# Patient Record
Sex: Female | Born: 1970 | Hispanic: No | Marital: Married | Smoking: Never smoker
Health system: Southern US, Community
[De-identification: ages and names within clinical notes are randomized; demographics above are authoritative.]

## PROBLEM LIST (undated history)

## (undated) DIAGNOSIS — R112 Nausea with vomiting, unspecified: Secondary | ICD-10-CM

## (undated) DIAGNOSIS — R51 Headache: Secondary | ICD-10-CM

## (undated) DIAGNOSIS — R519 Headache, unspecified: Secondary | ICD-10-CM

## (undated) DIAGNOSIS — R002 Palpitations: Secondary | ICD-10-CM

## (undated) DIAGNOSIS — J302 Other seasonal allergic rhinitis: Secondary | ICD-10-CM

## (undated) DIAGNOSIS — M1990 Unspecified osteoarthritis, unspecified site: Secondary | ICD-10-CM

## (undated) DIAGNOSIS — Z9889 Other specified postprocedural states: Secondary | ICD-10-CM

## (undated) DIAGNOSIS — E079 Disorder of thyroid, unspecified: Secondary | ICD-10-CM

## (undated) HISTORY — PX: WISDOM TOOTH EXTRACTION: SHX21

## (undated) HISTORY — DX: Disorder of thyroid, unspecified: E07.9

## (undated) HISTORY — PX: DILATION AND EVACUATION: SHX1459

---

## 1999-02-25 ENCOUNTER — Other Ambulatory Visit: Admission: RE | Admit: 1999-02-25 | Discharge: 1999-02-25 | Payer: Self-pay | Admitting: Obstetrics and Gynecology

## 2000-05-02 ENCOUNTER — Other Ambulatory Visit: Admission: RE | Admit: 2000-05-02 | Discharge: 2000-05-02 | Payer: Self-pay | Admitting: Family Medicine

## 2000-10-05 ENCOUNTER — Other Ambulatory Visit: Admission: RE | Admit: 2000-10-05 | Discharge: 2000-10-05 | Payer: Self-pay | Admitting: Family Medicine

## 2002-02-20 ENCOUNTER — Other Ambulatory Visit: Admission: RE | Admit: 2002-02-20 | Discharge: 2002-02-20 | Payer: Self-pay | Admitting: Obstetrics and Gynecology

## 2003-05-01 ENCOUNTER — Other Ambulatory Visit: Admission: RE | Admit: 2003-05-01 | Discharge: 2003-05-01 | Payer: Self-pay | Admitting: Obstetrics and Gynecology

## 2004-06-10 ENCOUNTER — Other Ambulatory Visit: Admission: RE | Admit: 2004-06-10 | Discharge: 2004-06-10 | Payer: Self-pay | Admitting: Obstetrics and Gynecology

## 2004-09-01 ENCOUNTER — Encounter: Admission: RE | Admit: 2004-09-01 | Discharge: 2004-09-01 | Payer: Self-pay | Admitting: Family Medicine

## 2005-08-04 ENCOUNTER — Other Ambulatory Visit: Admission: RE | Admit: 2005-08-04 | Discharge: 2005-08-04 | Payer: Self-pay | Admitting: Obstetrics and Gynecology

## 2014-11-03 LAB — CBC AND DIFFERENTIAL
HCT: 38 % (ref 36–46)
HEMOGLOBIN: 12.3 g/dL (ref 12.0–16.0)
Platelets: 303 10*3/uL (ref 150–399)
WBC: 6.3 10^3/mL

## 2014-11-03 LAB — LIPID PANEL
CHOLESTEROL: 158 mg/dL (ref 0–200)
HDL: 105 mg/dL — AB (ref 35–70)
LDL CALC: 94 mg/dL
LDL/HDL RATIO: 3
TRIGLYCERIDES: 55 mg/dL (ref 40–160)

## 2014-11-03 LAB — BASIC METABOLIC PANEL
BUN: 8 mg/dL (ref 4–21)
Creatinine: 0.6 mg/dL (ref 0.5–1.1)
GLUCOSE: 73 mg/dL
Potassium: 4.5 mmol/L (ref 3.4–5.3)
Sodium: 138 mmol/L (ref 137–147)

## 2014-11-03 LAB — HEPATIC FUNCTION PANEL
ALT: 22 U/L (ref 7–35)
AST: 19 U/L (ref 13–35)
BILIRUBIN, TOTAL: 0.8 mg/dL

## 2014-11-03 LAB — TSH: TSH: 0.01 u[IU]/mL — AB (ref <=5.90)

## 2014-12-01 ENCOUNTER — Ambulatory Visit (INDEPENDENT_AMBULATORY_CARE_PROVIDER_SITE_OTHER): Payer: Managed Care, Other (non HMO) | Admitting: Internal Medicine

## 2014-12-01 ENCOUNTER — Encounter: Payer: Self-pay | Admitting: Internal Medicine

## 2014-12-01 VITALS — BP 104/64 | HR 81 | Temp 98.5°F | Resp 12 | Ht 63.75 in | Wt 150.0 lb

## 2014-12-01 DIAGNOSIS — E0590 Thyrotoxicosis, unspecified without thyrotoxic crisis or storm: Secondary | ICD-10-CM

## 2014-12-01 LAB — TSH: TSH: 0.22 u[IU]/mL — AB (ref 0.35–4.50)

## 2014-12-01 LAB — T3, FREE: T3, Free: 3.4 pg/mL (ref 2.3–4.2)

## 2014-12-01 LAB — T4, FREE: Free T4: 1.19 ng/dL (ref 0.60–1.60)

## 2014-12-01 NOTE — Patient Instructions (Signed)
Please stop at the lab. Please try to join MyChart for easier communication. Please return in 3 months.  Hyperthyroidism The thyroid is a large gland located in the lower front part of your neck. The thyroid helps control metabolism. Metabolism is how your body uses food. It controls metabolism with the hormone thyroxine. When the thyroid is overactive, it produces too much hormone. When this happens, these following problems may occur:   Nervousness  Heat intolerance  Weight loss (in spite of increase food intake)  Diarrhea  Change in hair or skin texture  Palpitations (heart skipping or having extra beats)  Tachycardia (rapid heart rate)  Loss of menstruation (amenorrhea)  Shaking of the hands CAUSES  Grave's Disease (the immune system attacks the thyroid gland). This is the most common cause.  Inflammation of the thyroid gland.  Tumor (usually benign) in the thyroid gland or elsewhere.  Excessive use of thyroid medications (both prescription and 'natural').  Excessive ingestion of Iodine. DIAGNOSIS  To prove hyperthyroidism, your caregiver may do blood tests and ultrasound tests. Sometimes the signs are hidden. It may be necessary for your caregiver to watch this illness with blood tests, either before or after diagnosis and treatment. TREATMENT Short-term treatment There are several treatments to control symptoms. Drugs called beta blockers may give some relief. Drugs that decrease hormone production will provide temporary relief in many people. These measures will usually not give permanent relief. Definitive therapy There are treatments available which can be discussed between you and your caregiver which will permanently treat the problem. These treatments range from surgery (removal of the thyroid), to the use of radioactive iodine (destroys the thyroid by radiation), to the use of antithyroid drugs (interfere with hormone synthesis). The first two treatments are  permanent and usually successful. They most often require hormone replacement therapy for life. This is because it is impossible to remove or destroy the exact amount of thyroid required to make a person euthyroid (normal). HOME CARE INSTRUCTIONS  See your caregiver if the problems you are being treated for get worse. Examples of this would be the problems listed above. SEEK MEDICAL CARE IF: Your general condition worsens. MAKE SURE YOU:   Understand these instructions.  Will watch your condition.  Will get help right away if you are not doing well or get worse. Document Released: 11/21/2005 Document Revised: 02/13/2012 Document Reviewed: 04/04/2007 [facility] Patient Information 2015 [place], [place]. This information is not intended to replace advice given to you by your health care provider. Make sure you discuss any questions you have with your health care provider.

## 2014-12-01 NOTE — Progress Notes (Addendum)
Patient ID: Rose Alvarez, female   DOB: 10/08/1971, 43 y.o.   MRN: [id number]   HPI  Rose Alvarez is a 43 y.o.-year-old female, referred by her PCP, Dr. Charise CarwinSwaine, for management of thyrotoxicosis.  She had a physical exam recently (10/23/2014) >> incidentally found to have a low TSH.  I reviewed pt's thyroid tests: Lab Results  Component Value Date   TSH 0.01* 11/03/2014  11/03/2014: TSH <0.01; free T4 1.16 (0.61-1.12); total T3 163 (71-190) 10/23/2014: TSH <0.01  Pt denies feeling nodules in neck, hoarseness, dysphagia/odynophagia, SOB with lying down; she c/o: - + fatigue - + hot flushes; on OCP - + mm aches - + HAs - + chest tightness, no SOB - + palpitations - no tremors - no anxiety - occasional diarrhea - + mild weight loss - not since 10/2014  Pt does not have a FH of thyroid ds. No FH of thyroid cancer. No h/o radiation tx to head or neck.  No seaweed or kelp, no recent contrast studies. No steroid use. No herbal supplements.   Reviewed records from PCP: - 10/23/2014: CBC with diff: normal; CMP: normal; Lipid panel: 158/55/53/94  ROS: Constitutional: + occasional poor sleep Eyes: no blurry vision, c/o L lower eyelid swelling, no xerophthalmia ENT: no sore throat, no nodules palpated in throat, no dysphagia/odynophagia, no hoarseness Cardiovascular: + chest pressure/SOB/palpitations/+ leg swelling Respiratory: no cough/SOB Gastrointestinal: no N/V/+ D/no C Musculoskeletal: + muscle/+ joint aches Skin: no rashes, + easy bruising Neurological: no tremors/numbness/tingling/dizziness, + HAs Psychiatric: no depression/anxiety + low libido  Past Medical History  Diagnosis Date  . Thyroid disease     Hyperthyroidism   . Dyslipidemia     Hx of mild dyslipidemia   . Hyperlipidemia    Past Surgical History  Procedure Laterality Date  . Dilation and evacuation     History   Social History  . Marital Status: Married    Spouse Name: N/A    Number of  Children: 2   Occupational History  . CNA   Social History Main Topics  . Smoking status: Never Smoker   . Smokeless tobacco: No  . Alcohol Use: No  . Drug Use: No   Current Outpatient Rx  Name  Route  Sig  Dispense  Refill  . NORTREL 7/7/7 0.5/0.75/1-35 MG-MCG tablet                No Known Allergies   Family History  Problem Relation Age of Onset  . Hypertension Brother   . Diabetes Maternal Grandmother   . Heart disease Maternal Grandfather 56    Heart attack  . Heart disease Paternal Grandfather [id number]    Heart Attack   PE: BP 104/64 mmHg  Pulse 81  Temp(Src) 98.5 F (36.9 C) (Oral)  Resp 12  Ht 5' 3.75" (1.619 m)  Wt 150 lb (68.04 kg)  BMI 25.96 kg/m2  SpO2 98% Wt Readings from Last 3 Encounters:  12/01/14 150 lb (68.04 kg)   Constitutional: normal weight, in NAD Eyes: PERRLA, EOMI, no exophthalmos, no lid lag, no stare ENT: moist mucous membranes, no thyromegaly, L thyroid fullness; no thyroid bruits, no cervical lymphadenopathy Cardiovascular: RRR, No MRG Respiratory: CTA B Gastrointestinal: abdomen soft, NT, ND, BS+ Musculoskeletal: no deformities, strength intact in all 4 Skin: moist, warm, no rashes Neurological: no tremor with outstretched hands, DTR normal in all 4  ASSESSMENT: 1. Thyrotoxicosis  2. Possible L thyroid nodule  PLAN:  1. Patient with a recently found low  TSH, without clear thyrotoxic sxs (has some minimal weight loss, has hot flushes, occasional looser stools, and feels chest pressure).  - she does not appear to have exogenous causes for the low TSH.  - We discussed that possible causes of thyrotoxicosis are:  Graves ds   Thyroiditis toxic multinodular goiter/ toxic adenoma (I can feel a L sided nodule vs an enlarged/rotated L lobe at palpation of her thyroid). - I suggested that we check the TSH, fT3 and fT4 and also had thyroid stimulating antibodies to screen for Graves' disease.  - If the tests remain abnormal, we may need  an uptake and scan to differentiate between the 3 above possible etiologies  - we discussed about possible modalities of treatment for the above conditions, to include methimazole use, radioactive iodine ablation or (last resort) surgery. - I did advice her that we might need to do thyroid ultrasound depending on the results of the uptake and scan (if a cold nodule is present) - I do not feel that we need to add beta blockers at this time, since she is not tachycardic, anxious, or tremulous - I advised her to join my chart to communicate easier - RTC in 3 months, but likely sooner for repeat labs  Component     Latest Ref Rng 11/03/2014 12/01/2014  TSH     0.35 - 4.50 uIU/mL 0.01 (A) 0.22 (L)  T3, Free     2.3 - 4.2 pg/mL  3.4  Free T4     0.60 - 1.60 ng/dL  1.61  TSI     <096 % baseline  391 (H)   Likely resolving subacute thyroiditis >> repeat labs in 2 months.  02/18/2015  We repeated labs in 2 months, and they were worse: Component     Latest Ref Rng 01/27/2015  T3, Free     2.3 - 4.2 pg/mL 6.3 (H)  Free T4     0.60 - 1.60 ng/dL 0.45 (H)  TSH     4.09 - 4.50 uIU/mL 0.10 (L)   At that point, I arranged for a thyroid uptake and scan, that returned indicative of Graves' disease:  CLINICAL DATA: Thyrotoxicosis with weight loss, increased appetite, heat intolerance and difficulty sleeping. Serum TSH 0.1.  EXAM: THYROID SCAN AND UPTAKE - 24 HOURS  TECHNIQUE: Following the per oral administration of I-131 sodium iodide, the patient returned at 24 hours and uptake measurements were acquired with the uptake probe centered on the neck. Thyroid imaging was performed following the intravenous administration of the Tc-11m Pertechnetate.  RADIOPHARMACEUTICALS: 13.7 microCuries I-131 Sodium Iodide and 10.0 mCi TC-75m Pertechnetate  COMPARISON: None.  FINDINGS: The 24 hr uptake by the thyroid gland is 53.8%. Normal 24 hr uptake is 10-30 %.  On thyroid imaging, the  thyroid activity appears diffusely increased. No focal hot or cold nodules are identified.  IMPRESSION: Elevated 24 hour radio iodine uptake of 54% with homogeneous activity consistent with diffuse toxic goiter (Graves disease).   Electronically Signed  By: Carey Bullocks M.D.  On: 02/17/2015 17:28  At this point, I will suggest that patient starts methimazole, 5 mg once a day.  I will advise her to stop the Methimazole (Tapazole) and call us or your primary care doctor if you develop: - sore throat - fever - yellow skin - dark urine - light colored stools As we will then need to check your blood counts and liver tests.  At next visit, which is coming up in 12 days, we may  increase the dose to 5 mg twice a day she is tolerating it well and also depending on her symptoms.

## 2014-12-04 ENCOUNTER — Encounter: Payer: Self-pay | Admitting: Internal Medicine

## 2014-12-04 ENCOUNTER — Telehealth: Payer: Self-pay | Admitting: Internal Medicine

## 2014-12-04 NOTE — Telephone Encounter (Signed)
Sent MyChart message. Not all labs back yet.

## 2014-12-04 NOTE — Telephone Encounter (Signed)
Please read note below and advise.  

## 2014-12-04 NOTE — Telephone Encounter (Signed)
Patient is calling for the results of her lab work  Phone # [phone]

## 2014-12-06 LAB — THYROID STIMULATING IMMUNOGLOBULIN: TSI: 391 % baseline — ABNORMAL HIGH (ref <140)

## 2014-12-23 ENCOUNTER — Encounter: Payer: Self-pay | Admitting: Internal Medicine

## 2015-01-26 ENCOUNTER — Other Ambulatory Visit: Payer: Managed Care, Other (non HMO)

## 2015-01-27 ENCOUNTER — Other Ambulatory Visit (INDEPENDENT_AMBULATORY_CARE_PROVIDER_SITE_OTHER): Payer: Managed Care, Other (non HMO)

## 2015-01-27 DIAGNOSIS — E0590 Thyrotoxicosis, unspecified without thyrotoxic crisis or storm: Secondary | ICD-10-CM

## 2015-01-27 LAB — T4, FREE: FREE T4: 2.26 ng/dL — AB (ref 0.60–1.60)

## 2015-01-27 LAB — TSH: TSH: 0.1 u[IU]/mL — AB (ref 0.35–4.50)

## 2015-01-27 LAB — T3, FREE: T3 FREE: 6.3 pg/mL — AB (ref 2.3–4.2)

## 2015-01-28 ENCOUNTER — Other Ambulatory Visit: Payer: Self-pay | Admitting: Internal Medicine

## 2015-01-28 DIAGNOSIS — E0590 Thyrotoxicosis, unspecified without thyrotoxic crisis or storm: Secondary | ICD-10-CM

## 2015-01-28 MED ORDER — ATENOLOL 25 MG PO TABS: 25.0000 mg | ORAL_TABLET | Freq: Every day | ORAL | Status: DC

## 2015-02-02 ENCOUNTER — Telehealth: Payer: Self-pay | Admitting: Internal Medicine

## 2015-02-02 NOTE — Telephone Encounter (Signed)
Wanting to know if apt for scan has been set up yet

## 2015-02-03 NOTE — Telephone Encounter (Signed)
Scheduled pt's uptake and scan: March 14th and 15th at 12:45 pm both days. Called pt and advised. Pt understood.

## 2015-02-16 ENCOUNTER — Encounter (HOSPITAL_COMMUNITY)
Admission: RE | Admit: 2015-02-16 | Discharge: 2015-02-16 | Disposition: A | Discharge status: Home / Self Care | Payer: Managed Care, Other (non HMO) | Source: Ambulatory Visit | Attending: Internal Medicine | Admitting: Internal Medicine

## 2015-02-16 DIAGNOSIS — E0590 Thyrotoxicosis, unspecified without thyrotoxic crisis or storm: Secondary | ICD-10-CM | POA: Insufficient documentation

## 2015-02-16 MED ORDER — SODIUM IODIDE I 131 CAPSULE
13.7000 | Freq: Once | INTRAVENOUS | Status: AC | PRN
  Administered 2015-02-16: 13.7 via ORAL

## 2015-02-17 ENCOUNTER — Encounter (HOSPITAL_COMMUNITY)
Admission: RE | Admit: 2015-02-17 | Discharge: 2015-02-17 | Disposition: A | Discharge status: Home / Self Care | Payer: Managed Care, Other (non HMO) | Source: Ambulatory Visit | Attending: Internal Medicine | Admitting: Internal Medicine

## 2015-02-17 DIAGNOSIS — E0590 Thyrotoxicosis, unspecified without thyrotoxic crisis or storm: Secondary | ICD-10-CM | POA: Diagnosis not present

## 2015-02-17 MED ORDER — SODIUM PERTECHNETATE TC 99M INJECTION
10.0000 | Freq: Once | INTRAVENOUS | Status: AC | PRN
Start: 2015-02-17 — End: 2015-02-17
  Administered 2015-02-17: 10 via INTRAVENOUS

## 2015-02-18 ENCOUNTER — Other Ambulatory Visit: Payer: Self-pay | Admitting: Internal Medicine

## 2015-02-18 DIAGNOSIS — E0500 Thyrotoxicosis with diffuse goiter without thyrotoxic crisis or storm: Secondary | ICD-10-CM | POA: Insufficient documentation

## 2015-02-18 MED ORDER — METHIMAZOLE 5 MG PO TABS: 5.0000 mg | ORAL_TABLET | Freq: Every day | ORAL | Status: DC

## 2015-02-18 NOTE — Addendum Note (Signed)
Addended by: Carlus PavlovGHERGHE, Jaiceon Collister on: 02/18/2015 02:13 PM   Modules accepted: Level of Service

## 2015-03-02 ENCOUNTER — Encounter: Payer: Self-pay | Admitting: Internal Medicine

## 2015-03-02 ENCOUNTER — Ambulatory Visit (INDEPENDENT_AMBULATORY_CARE_PROVIDER_SITE_OTHER): Payer: Managed Care, Other (non HMO) | Admitting: Internal Medicine

## 2015-03-02 VITALS — BP 112/60 | HR 75 | Temp 98.1°F | Resp 12 | Wt 148.0 lb

## 2015-03-02 DIAGNOSIS — E0500 Thyrotoxicosis with diffuse goiter without thyrotoxic crisis or storm: Secondary | ICD-10-CM

## 2015-03-02 NOTE — Patient Instructions (Signed)
Please come back for labs in 2-3 weeks.  Continue Methimazole 5 mg daily.   Please come back for a follow-up appointment in 4 months.

## 2015-03-02 NOTE — Progress Notes (Signed)
Patient ID: Rose Alvarez, female   DOB: Apr 17, 1971, 44 y.o.   MRN: [id number]  HPI  Rose Alvarez is a 44 y.o.-year-old female, initially referred by her PCP, Dr. Azucena CecilSwayne, now returning for f/u for a new dx of Graves ds.  Reviewed and addended hx:  She had a physical exam 10/23/2014 >> incidentally found to have a low TSH.  I reviewed pt's thyroid tests: Lab Results  Component Value Date   TSH 0.10* 01/27/2015   TSH 0.22* 12/01/2014   TSH 0.01* 11/03/2014   FREET4 2.26* 01/27/2015   FREET4 1.19 12/01/2014  11/03/2014: TSH <0.01; free T4 1.16 (0.61-1.12); total T3 163 (71-190) 10/23/2014: TSH <0.01  After last visit, we checked a thyroid Uptake and Scan (02/17/2015) >> c/w Graves ds. Elevated 24 hour radio iodine uptake of 54% with homogeneous activity consistent with diffuse toxic goiter (Graves disease).  We started MMI 5 mg daily. She continues her Atenolol 25 mg daily.  Pt denies feeling nodules in neck, hoarseness, dysphagia/odynophagia, SOB with lying down; she c/o: - + fatigue - a little better hot flushes; on OCP - + mm aches - + HAs (allergies) - resolved chest tightness, no SOB - improved palpitations  -mostly at night - no tremors - no tremors - no anxiety - occasional diarrhea - + mild weight loss - not since 10/2014  She saw ophthalmologist for L eye swelling >> was told it is either 2/2 allergies or thyroid ds.   Reviewed records from PCP: - 10/23/2014: CBC with diff: normal; CMP: normal; Lipid panel: 158/55/53/94  ROS: Constitutional: + see HPI Eyes: no blurry vision, + L lower eyelid swelling, no xerophthalmia ENT: no sore throat, no nodules palpated in throat, no dysphagia/odynophagia, no hoarseness Cardiovascular: no CP/SOB/+ rarely palpitations/leg swelling Respiratory: no cough/SOB Gastrointestinal: no N/V/D/C Musculoskeletal:no muscle/joint aches Skin: no rashes, + easy bruising Neurological: no tremors/numbness/tingling/dizziness, +  HAs  Past Medical History  Diagnosis Date  . Thyroid disease     Hyperthyroidism   . Dyslipidemia     Hx of mild dyslipidemia   . Hyperlipidemia    Past Surgical History  Procedure Laterality Date  . Dilation and evacuation     History   Social History  . 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 Smoking status: Never Smoker   . Smokeless tobacco: No  . Alcohol Use: No  . Drug Use: No   Current Outpatient Rx  Name  Route  Sig  Dispense  Refill  . atenolol (TENORMIN) 25 MG tablet   Oral   Take 1 tablet (25 mg total) by mouth daily.   30 tablet   2   . methimazole (TAPAZOLE) 5 MG tablet   Oral   Take 1 tablet (5 mg total) by mouth daily.   30 tablet   1   . NORTREL 7/7/7 0.5/0.75/1-35 MG-MCG tablet                No Known Allergies   Family History  Problem Relation Age of Onset  . Hypertension Brother   . Diabetes Maternal Grandmother   . Heart disease Maternal Grandfather 56    Heart attack  . Heart disease Paternal Grandfather [id number]    Heart Attack   PE: BP 112/60 mmHg  Pulse 75  Temp(Src) 98.1 F (36.7 C) (Oral)  Resp 12  Wt 148  lb (67.132 kg)  SpO2 98%  LMP 02/13/2015 (Approximate) Wt Readings from Last 3 Encounters:  03/02/15 148 lb (67.132 kg)  12/01/14 150 lb (68.04 kg)   Constitutional: normal weight, in NAD Eyes: PERRLA, EOMI, no exophthalmos, no lid lag, no stare; L lower eyelid more swollen than R and L upper eyelid with mild ptosis ENT: moist mucous membranes, no thyromegaly, L thyroid fullness, no cervical lymphadenopathy Cardiovascular: RRR, No MRG Respiratory: CTA B Gastrointestinal: abdomen soft, NT, ND, BS+ Musculoskeletal: no deformities, strength intact in all 4 Skin: moist, warm, no rashes Neurological: no tremor with outstretched hands, DTR normal in all 4  ASSESSMENT: 1. Graves ds  02/17/2015: Thyroid Uptake and  scan: Elevated 24 hour radio iodine uptake of 54% with homogeneous activity consistent with diffuse toxic goiter (Graves disease).  2. Possible L thyroid nodule  PLAN:  1. Patient with a recently diagnosed Graves ds., without thyrotoxic sxs  - we reviewed her Uptake and scan results with her and her husband >> I explained what Graves ds. means  - will check the TSH, fT3 and fT4 in 2-3 weeks (5-6 weeks after starting MMI) - she is doing well on MMI - discussed poss SEs - we again discussed about possible modalities of treatment for the above conditions, to include methimazole use, radioactive iodine ablation or (last resort) surgery. For now, will continue MMI 5 mg daily - we might need to do thyroid ultrasound after Graves ds. Resolves (for the L thyroid fullness) - continue beta blocker for now - RTC in 4 months, but likely sooner for repeat labs

## 2015-03-23 ENCOUNTER — Other Ambulatory Visit (INDEPENDENT_AMBULATORY_CARE_PROVIDER_SITE_OTHER): Payer: Managed Care, Other (non HMO)

## 2015-03-23 ENCOUNTER — Other Ambulatory Visit: Payer: Self-pay | Admitting: Internal Medicine

## 2015-03-23 DIAGNOSIS — E0500 Thyrotoxicosis with diffuse goiter without thyrotoxic crisis or storm: Secondary | ICD-10-CM

## 2015-03-23 LAB — T3, FREE: T3, Free: 6.4 pg/mL — ABNORMAL HIGH (ref 2.3–4.2)

## 2015-03-23 LAB — T4, FREE: FREE T4: 3.07 ng/dL — AB (ref 0.60–1.60)

## 2015-03-23 LAB — TSH: TSH: 0.04 u[IU]/mL — ABNORMAL LOW (ref 0.35–4.50)

## 2015-03-23 MED ORDER — METHIMAZOLE 5 MG PO TABS: ORAL_TABLET | ORAL | Status: DC

## 2015-04-13 ENCOUNTER — Encounter: Payer: Self-pay | Admitting: Internal Medicine

## 2015-04-13 ENCOUNTER — Telehealth: Payer: Self-pay | Admitting: Internal Medicine

## 2015-04-13 MED ORDER — METHIMAZOLE 5 MG PO TABS: ORAL_TABLET | ORAL | Status: DC

## 2015-04-13 NOTE — Telephone Encounter (Signed)
Pt needs refill nethnazole

## 2015-04-25 ENCOUNTER — Other Ambulatory Visit: Payer: Self-pay | Admitting: Internal Medicine

## 2015-04-30 ENCOUNTER — Other Ambulatory Visit: Payer: Self-pay | Admitting: *Deleted

## 2015-04-30 MED ORDER — ATENOLOL 25 MG PO TABS: 25.0000 mg | ORAL_TABLET | Freq: Every day | ORAL | Status: DC

## 2015-07-02 ENCOUNTER — Ambulatory Visit (INDEPENDENT_AMBULATORY_CARE_PROVIDER_SITE_OTHER): Payer: Managed Care, Other (non HMO) | Admitting: Internal Medicine

## 2015-07-02 ENCOUNTER — Encounter: Payer: Self-pay | Admitting: Internal Medicine

## 2015-07-02 VITALS — BP 114/60 | HR 67 | Temp 97.7°F | Ht 63.75 in | Wt 151.0 lb

## 2015-07-02 DIAGNOSIS — E0500 Thyrotoxicosis with diffuse goiter without thyrotoxic crisis or storm: Secondary | ICD-10-CM | POA: Diagnosis not present

## 2015-07-02 LAB — T4, FREE: Free T4: 1.44 ng/dL (ref 0.60–1.60)

## 2015-07-02 LAB — T3, FREE: T3, Free: 4 pg/mL (ref 2.3–4.2)

## 2015-07-02 LAB — TSH: TSH: 0.04 u[IU]/mL — ABNORMAL LOW (ref 0.35–4.50)

## 2015-07-02 NOTE — Patient Instructions (Addendum)
For now, continue 10 mg of Methimazole with dinner.  Please stop at the lab.  Please come back for a follow-up appointment in 4 months.

## 2015-07-02 NOTE — Progress Notes (Signed)
Patient ID: Rose Alvarez, female   DOB: Aug 19, 1971, 44 y.o.   MRN: [id number]  HPI  Rose Alvarez is a 44 y.o.-year-old female, initially referred by her PCP, Dr. Azucena Cecil, now returning for f/u for Graves ds. Last visit 4 mo ago.   Reviewed and addended hx:  She had a physical exam 10/23/2014 >> incidentally found to have a low TSH.  I reviewed pt's thyroid tests: Lab Results  Component Value Date   TSH 0.04* 03/23/2015   TSH 0.10* 01/27/2015   TSH 0.22* 12/01/2014   TSH 0.01* 11/03/2014   FREET4 3.07* 03/23/2015   FREET4 2.26* 01/27/2015   FREET4 1.19 12/01/2014  11/03/2014: TSH <0.01; free T4 1.16 (0.61-1.12); total T3 163 (71-190) 10/23/2014: TSH <0.01  We checked a thyroid Uptake and Scan (02/17/2015) >> c/w Graves ds. Elevated 24 hour radio iodine uptake of 54% with homogeneous activity consistent with diffuse toxic goiter (Graves disease).  We started MMI, I suggested to increase dose to 10 mg in am and 5 mg in pm (on 03/2015 from 5 mg bid). She tells me she is forgetting the first dose of the day >> we basically increased from 5 mg at night to 10 mg at night. She did not return for labs in 6 weeks after the change in dose. She continues her Atenolol 25 mg daily.  Pt denies feeling nodules in neck, hoarseness, dysphagia/odynophagia, SOB with lying down; she c/o: - still has L eye swelling, no blurry vision - improved palpitations - + fatigue - improved hot flushes; on OCP - no mm aches - resolved chest tightness, no SOB - no tremors - no tremors - no anxiety - no weight loss  She saw ophthalmologist for L eye swelling >> was told it is either 2/2 allergies or thyroid ds. (before her Graves dx.)  Reviewed records from PCP: - 10/23/2014: CBC with diff: normal; CMP: normal; Lipid panel: 158/55/53/94  ROS: Constitutional: + see HPI Eyes: no blurry vision, + L lower eyelid swelling, slight L ptosis, no xerophthalmia ENT: no sore throat, no nodules palpated in  throat, no dysphagia/odynophagia, no hoarseness Cardiovascular: no CP/SOB/palpitations/leg swelling Respiratory: no cough/SOB Gastrointestinal: no N/V/D/C Musculoskeletal:no muscle/joint aches Skin: no rashes, no easy bruising Neurological: no tremors/numbness/tingling/dizziness  Past Medical History  Diagnosis Date  . Thyroid disease     Hyperthyroidism   . Dyslipidemia     Hx of mild dyslipidemia   . Hyperlipidemia    Past Surgical History  Procedure Laterality Date  . Dilation and evacuation     History   Social History  . 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 Smoking status: Never Smoker   . Smokeless tobacco: No  . Alcohol Use: No  . Drug Use: No   Current Outpatient Rx  Name  Route  Sig  Dispense  Refill  . atenolol (TENORMIN) 25 MG tablet   Oral   Take 1 tablet (25 mg total) by mouth daily.   30 tablet   2   . methimazole (TAPAZOLE) 5 MG tablet      Take 10 mg in a.m. and 5 mg in p.m. Daily.   90 tablet   2   . NORTREL 7/7/7 0.5/0.75/1-35 MG-MCG tablet                No Known Allergies   Family History  Problem Relation Age of Onset  . Hypertension Brother   . Diabetes Maternal Grandmother   . Heart disease Maternal Grandfather 56    Heart attack  . Heart disease Paternal Grandfather 39    Heart Attack   PE: BP 114/60 mmHg  Pulse 67  Temp(Src) 97.7 F (36.5 C) (Oral)  Ht 5' 3.75" (1.619 m)  Wt 151 lb (68.493 kg)  BMI 26.13 kg/m2  SpO2 95% Wt Readings from Last 3 Encounters:  07/02/15 151 lb (68.493 kg)  03/02/15 148 lb (67.132 kg)  12/01/14 150 lb (68.04 kg)   Constitutional: normal weight, in NAD Eyes: PERRLA, EOMI, no exophthalmos, no lid lag, no stare; L lower eyelid more swollen than R and L upper eyelid with mild ptosis ENT: moist mucous membranes, no thyromegaly, L thyroid fullness, no cervical lymphadenopathy Cardiovascular:  RRR, No MRG Respiratory: CTA B Gastrointestinal: abdomen soft, NT, ND, BS+ Musculoskeletal: no deformities, strength intact in all 4 Skin: moist, warm, no rashes Neurological: no tremor with outstretched hands, DTR normal in all 4  ASSESSMENT: 1. Graves ds  02/17/2015: Thyroid Uptake and scan: Elevated 24 hour radio iodine uptake of 54% with homogeneous activity consistent with diffuse toxic goiter (Graves disease).  2. Possible L thyroid nodule  PLAN:  1. Patient with a fairly recently diagnosed Graves ds., without recent hyrotoxic sxs - we had to increase the MMI dose after last check. She is taking less MMI than Rx'ed as she forgets the am dose... We discussed that if we need to increase the dose, she needs to make an effort to remember this. - we reviewed her Uptake and scan results  - For now, will continue MMI 10 mg daily - she is aware of poss. SEs from methimazole - will check the TSH, fT3 and fT4 and add TSI today - we will need to do thyroid ultrasound after Graves ds. resolves (for the L thyroid fullness) - continue beta blocker for now - I advised her to see ophthalmology again Re: L eye swelling and ptosis >> does she need a head CT?  - RTC in 4 months, but likely sooner for repeat labs  Component     Latest Ref Rng 07/02/2015  TSH     0.35 - 4.50 uIU/mL 0.04 (L)  T3, Free     2.3 - 4.2 pg/mL 4.0  Free T4     0.60 - 1.60 ng/dL 1.61  TSI still pending.  Free T4 and free T3 markedly improved. TSH is similar to before, however, this lacks behind the rest of the tests during resolution of Graves' disease. I will continue with the same dose of methimazole for now, 10 mg daily, and would like to recheck her thyroid tests in 5-6 weeks.

## 2015-07-07 LAB — THYROID STIMULATING IMMUNOGLOBULIN: TSI: 363 %{baseline} — AB (ref <140)

## 2015-07-30 ENCOUNTER — Other Ambulatory Visit: Payer: Self-pay | Admitting: Internal Medicine

## 2015-11-02 ENCOUNTER — Encounter: Payer: Self-pay | Admitting: Internal Medicine

## 2015-11-02 ENCOUNTER — Ambulatory Visit (INDEPENDENT_AMBULATORY_CARE_PROVIDER_SITE_OTHER): Payer: Managed Care, Other (non HMO) | Admitting: Internal Medicine

## 2015-11-02 VITALS — BP 118/68 | HR 76 | Temp 97.4°F | Resp 12 | Wt 158.0 lb

## 2015-11-02 DIAGNOSIS — E0500 Thyrotoxicosis with diffuse goiter without thyrotoxic crisis or storm: Secondary | ICD-10-CM | POA: Diagnosis not present

## 2015-11-02 LAB — TSH: TSH: 0.05 u[IU]/mL — AB (ref 0.35–4.50)

## 2015-11-02 LAB — T3, FREE: T3, Free: 3.1 pg/mL (ref 2.3–4.2)

## 2015-11-02 LAB — T4, FREE: Free T4: 1.05 ng/dL (ref 0.60–1.60)

## 2015-11-02 NOTE — Progress Notes (Signed)
Patient ID: Rose Alvarez, female   DOB: 12-29-1970, 44 y.o.   MRN: [id number]  HPI  Rose Alvarez is a 44 y.o.-year-old female, initially referred by her PCP, Dr. Azucena Cecil, now returning for f/u for Graves ds. Last visit 4 mo ago.   Reviewed and addended hx:  She had a physical exam 10/23/2014 >> incidentally found to have a low TSH.  I reviewed pt's thyroid tests: Lab Results  Component Value Date   TSH 0.04* 07/02/2015   TSH 0.04* 03/23/2015   TSH 0.10* 01/27/2015   TSH 0.22* 12/01/2014   TSH 0.01* 11/03/2014   FREET4 1.44 07/02/2015   FREET4 3.07* 03/23/2015   FREET4 2.26* 01/27/2015   FREET4 1.19 12/01/2014  11/03/2014: TSH <0.01; free T4 1.16 (0.61-1.12); total T3 163 (71-190) 10/23/2014: TSH <0.01  We checked a thyroid Uptake and Scan (02/17/2015) >> c/w Graves ds. Elevated 24 hour radio iodine uptake of 54% with homogeneous activity consistent with diffuse toxic goiter (Graves disease).  We started MMI, then increased dose to 10 mg in am and 5 mg in pm, then 20 mg once a day (instead of 10 mg bid). She again did not return for labs in 6 weeks after the change in dose. She continues her Atenolol 25 mg daily.  Pt denies feeling nodules in neck, hoarseness, dysphagia/odynophagia, SOB with lying down.  She c/o: - + chest tightness, no palpitations, no SOB - + fatigue, same - still has L eye swelling, no blurry vision >> saw ophthalmologist >> allergy or thyroid ds.  - improved hot flushes, now again worse; on OCP - no mm aches - no tremors - no tremors - no anxiety - no weight loss, gained 10 lbs since 02/2015  She saw ophthalmologist for L eye swelling >> was told it is either 2/2 allergies or thyroid ds. (before her Graves dx.)  Records from PCP: - 10/23/2014: CBC with diff: normal; CMP: normal; Lipid panel: 158/55/53/94  ROS: Constitutional: + see HPI Eyes: no blurry vision, + L lower eyelid swelling, slight L ptosis, no xerophthalmia ENT: no sore throat,  no nodules palpated in throat, no dysphagia/odynophagia, no hoarseness Cardiovascular: no CP/SOB/palpitations/leg swelling Respiratory: no cough/SOB Gastrointestinal: no N/V/D/C Musculoskeletal:no muscle/joint aches Skin: no rashes, no easy bruising Neurological: no tremors/numbness/tingling/dizziness  I reviewed pt's medications, allergies, PMH, social hx, family hx, and changes were documented in the history of present illness. Otherwise, unchanged from my initial visit note.  Past Medical History  Diagnosis Date  . Thyroid disease     Hyperthyroidism   . Dyslipidemia     Hx of mild dyslipidemia   . Hyperlipidemia    Past Surgical History  Procedure Laterality Date  . Dilation and evacuation     History   Social History  . 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 Smoking status: Never Smoker   . Smokeless tobacco: No  . Alcohol Use: No  . Drug Use: No   Current Outpatient Rx  Name  Route  Sig  Dispense  Refill  . atenolol (TENORMIN) 25 MG tablet      TAKE 1 TABLET (25 MG TOTAL) BY MOUTH DAILY.   30 tablet   2   . methimazole (TAPAZOLE) 5 MG tablet   Oral   Take 2 tablets (10 mg total) by mouth daily.   60 tablet   1   .  NORTREL 7/7/7 0.5/0.75/1-35 MG-MCG tablet                No Known Allergies   Family History  Problem Relation Age of Onset  . Hypertension Brother   . Diabetes Maternal Grandmother   . Heart disease Maternal Grandfather 56    Heart attack  . Heart disease Paternal Grandfather [id number]    Heart Attack   PE: BP 118/68 mmHg  Pulse 76  Temp(Src) 97.4 F (36.3 C) (Oral)  Resp 12  Wt 158 lb (71.668 kg)  SpO2 96% Body mass index is 27.34 kg/(m^2). Wt Readings from Last 3 Encounters:  11/02/15 158 lb (71.668 kg)  07/02/15 151 lb (68.493 kg)  03/02/15 148 lb (67.132 kg)   Constitutional: normal weight, in NAD Eyes: PERRLA, EOMI, no  exophthalmos, no lid lag, no stare; L lower eyelid more swollen than R and L upper eyelid with mild ptosis ENT: moist mucous membranes, no thyromegaly, L thyroid fullness, no cervical lymphadenopathy Cardiovascular: RRR, No MRG Respiratory: CTA B Gastrointestinal: abdomen soft, NT, ND, BS+ Musculoskeletal: no deformities, strength intact in all 4 Skin: moist, warm, no rashes Neurological: no tremor with outstretched hands, DTR normal in all 4  ASSESSMENT: 1. Graves ds  02/17/2015: Thyroid Uptake and scan: Elevated 24 hour radio iodine uptake of 54% with homogeneous activity consistent with diffuse toxic goiter (Graves disease).  2. Possible L thyroid nodule  PLAN:  1. Patient with Graves ds., with now more thyrotoxic sxs in the last 3-4 weeks (on MMI 20 mg daily).  - For now, will continue MMI 20 mg daily, but split this in 2 daily doses - she is aware of poss. SEs from methimazole - will check the TSH, fT3 and fT4 today - if the labs are not tyrotoxic >> advised to contact PCP Re: her chest tightness - we will need to do thyroid ultrasound after Graves ds. resolves (for the L thyroid fullness) - continue beta blocker for now - she saw ophthalmology Re: L eye swelling and ptosis >> this is considered to be either from allergies or from her Graves ds. He did not deem a CT head necessary for now. - RTC in 4 months, but likely sooner for repeat labs  Component     Latest Ref Rng 11/02/2015  TSH     0.35 - 4.50 uIU/mL 0.05 (L)  T3, Free     2.3 - 4.2 pg/mL 3.1  Free T4     0.60 - 1.60 ng/dL 4.091.05   Pt with still low TSH on MMI 20 mg daily. Will split MMI in 2 doses and will recheck TFTs in 6 weeks. If TSH not improving, may need RAI Tx.

## 2015-11-02 NOTE — Patient Instructions (Signed)
Please stop at the lab.  Continue Methimazole 20 mg daily, but split the dose in 10 mg 2x a day.  Please return in 4 months.

## 2015-11-03 ENCOUNTER — Other Ambulatory Visit: Payer: Self-pay | Admitting: Internal Medicine

## 2015-11-12 ENCOUNTER — Other Ambulatory Visit: Payer: Self-pay | Admitting: Internal Medicine

## 2015-12-15 ENCOUNTER — Other Ambulatory Visit: Payer: Managed Care, Other (non HMO)

## 2015-12-15 ENCOUNTER — Other Ambulatory Visit: Payer: Self-pay | Admitting: Family Medicine

## 2015-12-15 ENCOUNTER — Other Ambulatory Visit (INDEPENDENT_AMBULATORY_CARE_PROVIDER_SITE_OTHER): Payer: Managed Care, Other (non HMO)

## 2015-12-15 DIAGNOSIS — R1011 Right upper quadrant pain: Secondary | ICD-10-CM

## 2015-12-15 DIAGNOSIS — E0500 Thyrotoxicosis with diffuse goiter without thyrotoxic crisis or storm: Secondary | ICD-10-CM | POA: Diagnosis not present

## 2015-12-15 LAB — TSH: TSH: 0.15 u[IU]/mL — AB (ref 0.35–4.50)

## 2015-12-15 LAB — T4, FREE: Free T4: 1.17 ng/dL (ref 0.60–1.60)

## 2015-12-15 LAB — T3, FREE: T3, Free: 3.5 pg/mL (ref 2.3–4.2)

## 2015-12-17 ENCOUNTER — Ambulatory Visit
Admission: RE | Admit: 2015-12-17 | Discharge: 2015-12-17 | Disposition: A | Discharge status: Home / Self Care | Payer: Managed Care, Other (non HMO) | Source: Ambulatory Visit | Attending: Family Medicine | Admitting: Family Medicine

## 2015-12-17 DIAGNOSIS — R1011 Right upper quadrant pain: Secondary | ICD-10-CM

## 2015-12-29 ENCOUNTER — Other Ambulatory Visit: Payer: Self-pay | Admitting: General Surgery

## 2016-02-03 NOTE — Pre-Procedure Instructions (Signed)
Rose Alvarez  02/03/2016      [id number] [place], [place] - [address] [place] [address] Phone: [phone] Fax: [phone]    Your procedure is scheduled on Wed, Mar 8 @ 2:00 PM  Report to [facility] Admitting at 12:00 PM  Call this number if you have problems the morning of surgery:  [phone]   Remember:  Do not eat food or drink liquids after midnight.  Take these medicines the morning of surgery with A SIP OF WATER Atenolol(Tenormin),Allegra(Fexofenadine),and Flonase(Fluticasone)            Stop taking your Naproxen. No Goody's,BC's,Aspirin,Ibuprofen,Motrin,Advil,Fish Oil,or any Herbal Medications.    Do not wear jewelry, make-up or nail polish.  Do not wear lotions, powders, or perfumes.  You may wear deodorant.  Do not shave 48 hours prior to surgery.    Do not bring valuables to the hospital.  [facility] is not responsible for any belongings or valuables.  Contacts, dentures or bridgework may not be worn into surgery.  Leave your suitcase in the car.  After surgery it may be brought to your room.  For patients admitted to the hospital, discharge time will be determined by your treatment team.  Patients discharged the day of surgery will not be allowed to drive home.    Special instructions:  [place] - Preparing for Surgery  Before surgery, you can play an important role.  Because skin is not sterile, your skin needs to be as free of germs as possible.  You can reduce the number of germs on you skin by washing with CHG (chlorahexidine gluconate) soap before surgery.  CHG is an antiseptic cleaner which kills germs and bonds with the skin to continue killing germs even after washing.  Please DO NOT use if you have an allergy to CHG or antibacterial soaps.  If your skin becomes reddened/irritated stop using the CHG and inform your nurse when you arrive at Short Stay.  Do not  shave (including legs and underarms) for at least 48 hours prior to the first CHG shower.  You may shave your face.  Please follow these instructions carefully:   1.  Shower with CHG Soap the night before surgery and the                                morning of Surgery.  2.  If you choose to wash your hair, wash your hair first as usual with your       normal shampoo.  3.  After you shampoo, rinse your hair and body thoroughly to remove the                      Shampoo.  4.  Use CHG as you would any other liquid soap.  You can apply chg directly       to the skin and wash gently with scrungie or a clean washcloth.  5.  Apply the CHG Soap to your body ONLY FROM THE NECK DOWN.        Do not use on open wounds or open sores.  Avoid contact with your eyes,       ears, mouth and genitals (private parts).  Wash genitals (private parts)       with your normal soap.  6.  Wash thoroughly,  paying special attention to the area where your surgery        will be performed.  7.  Thoroughly rinse your body with warm water from the neck down.  8.  DO NOT shower/wash with your normal soap after using and rinsing off       the CHG Soap.  9.  Pat yourself dry with a clean towel.            10.  Wear clean pajamas.            11.  Place clean sheets on your bed the night of your first shower and do not        sleep with pets.  Day of Surgery  Do not apply any lotions/deoderants the morning of surgery.  Please wear clean clothes to the hospital/surgery center.    Please read over the following fact sheets that you were given. Pain Booklet, Coughing and Deep Breathing and Surgical Site Infection Prevention

## 2016-02-04 ENCOUNTER — Encounter (HOSPITAL_COMMUNITY): Payer: Self-pay

## 2016-02-04 ENCOUNTER — Encounter (HOSPITAL_COMMUNITY)
Admission: RE | Admit: 2016-02-04 | Discharge: 2016-02-04 | Disposition: A | Discharge status: Home / Self Care | Payer: Managed Care, Other (non HMO) | Source: Ambulatory Visit | Attending: General Surgery | Admitting: General Surgery

## 2016-02-04 DIAGNOSIS — E0590 Thyrotoxicosis, unspecified without thyrotoxic crisis or storm: Secondary | ICD-10-CM | POA: Insufficient documentation

## 2016-02-04 DIAGNOSIS — Z01812 Encounter for preprocedural laboratory examination: Secondary | ICD-10-CM | POA: Diagnosis not present

## 2016-02-04 DIAGNOSIS — K8020 Calculus of gallbladder without cholecystitis without obstruction: Secondary | ICD-10-CM | POA: Diagnosis not present

## 2016-02-04 DIAGNOSIS — Z01818 Encounter for other preprocedural examination: Secondary | ICD-10-CM | POA: Insufficient documentation

## 2016-02-04 DIAGNOSIS — Z79899 Other long term (current) drug therapy: Secondary | ICD-10-CM | POA: Insufficient documentation

## 2016-02-04 HISTORY — DX: Headache, unspecified: R51.9

## 2016-02-04 HISTORY — DX: Headache: R51

## 2016-02-04 HISTORY — DX: Palpitations: R00.2

## 2016-02-04 HISTORY — DX: Other specified postprocedural states: R11.2

## 2016-02-04 HISTORY — DX: Other seasonal allergic rhinitis: J30.2

## 2016-02-04 HISTORY — DX: Other specified postprocedural states: Z98.890

## 2016-02-04 HISTORY — DX: Unspecified osteoarthritis, unspecified site: M19.90

## 2016-02-04 LAB — COMPREHENSIVE METABOLIC PANEL
ALT: 17 U/L (ref 14–54)
ANION GAP: 10 (ref 5–15)
AST: 20 U/L (ref 15–41)
Albumin: 3.4 g/dL — ABNORMAL LOW (ref 3.5–5.0)
Alkaline Phosphatase: 50 U/L (ref 38–126)
BUN: 6 mg/dL (ref 6–20)
CHLORIDE: 106 mmol/L (ref 101–111)
CO2: 24 mmol/L (ref 22–32)
Calcium: 9.4 mg/dL (ref 8.9–10.3)
Creatinine, Ser: 0.66 mg/dL (ref 0.44–1.00)
GFR calc non Af Amer: >60 mL/min (ref >60)
Glucose, Bld: 91 mg/dL (ref 65–99)
Potassium: 4 mmol/L (ref 3.5–5.1)
SODIUM: 140 mmol/L (ref 135–145)
Total Bilirubin: 0.5 mg/dL (ref 0.3–1.2)
Total Protein: 6.4 g/dL — ABNORMAL LOW (ref 6.5–8.1)

## 2016-02-04 LAB — CBC
HCT: 36.3 % (ref 36.0–46.0)
Hemoglobin: 12 g/dL (ref 12.0–15.0)
MCH: 28.8 pg (ref 26.0–34.0)
MCHC: 33.1 g/dL (ref 30.0–36.0)
MCV: 87.3 fL (ref 78.0–100.0)
PLATELETS: 301 10*3/uL (ref 150–400)
RBC: 4.16 MIL/uL (ref 3.87–5.11)
RDW: 12 % (ref 11.5–15.5)
WBC: 7.1 10*3/uL (ref 4.0–10.5)

## 2016-02-04 LAB — HCG, SERUM, QUALITATIVE: PREG SERUM: NEGATIVE

## 2016-02-04 MED ORDER — CHLORHEXIDINE GLUCONATE 4 % EX LIQD: 1.0000 "application " | Freq: Once | CUTANEOUS | Status: DC

## 2016-02-04 NOTE — Progress Notes (Signed)
Pt denies having cardiologist, denies EKG in the past year, denies ever having echo, stress, or heart cath.

## 2016-02-05 NOTE — Progress Notes (Signed)
Anesthesia Chart Review:  Pt is a 45 year old female scheduled for laparoscopic cholecystectomy with intraoperative cholangiogram on 02/10/2016 with Dr. Carolynne Edouardoth.   PCP is Dr. Tally Joeavid Swayne. Endocrinologist is Dr. Carlus Pavlovristina Gherghe  PMH includes:  Palpitations, hyperthyroidism, post-op N/V. Never smoker. BMI 29  Medications include: atenolol, methimazole  Preoperative labs reviewed.    EKG 02/04/16: NSR. Poor R wave progression. Cannot rule out Anterior infarct, age undetermined. Non-specific ST-t changes.   If no changes, I anticipate pt can proceed with surgery as scheduled.   Rica Mastngela Nayelli Inglis, FNP-BC [facility] Short Stay [facility]/Anesthesiology Phone: [phone] 02/05/2016 12:42 PM

## 2016-02-08 ENCOUNTER — Other Ambulatory Visit: Payer: Self-pay | Admitting: Internal Medicine

## 2016-02-09 MED ORDER — CEFAZOLIN SODIUM-DEXTROSE 2-3 GM-% IV SOLR
2.0000 g | INTRAVENOUS | Status: AC
  Administered 2016-02-10: 2 g via INTRAVENOUS

## 2016-02-10 ENCOUNTER — Ambulatory Visit (HOSPITAL_COMMUNITY)
Admission: RE | Admit: 2016-02-10 | Discharge: 2016-02-10 | Disposition: A | Discharge status: Home / Self Care | Payer: Managed Care, Other (non HMO) | Source: Ambulatory Visit | Attending: General Surgery | Admitting: General Surgery

## 2016-02-10 ENCOUNTER — Ambulatory Visit (HOSPITAL_COMMUNITY): Payer: Managed Care, Other (non HMO) | Admitting: Emergency Medicine

## 2016-02-10 ENCOUNTER — Ambulatory Visit (HOSPITAL_COMMUNITY): Payer: Managed Care, Other (non HMO)

## 2016-02-10 ENCOUNTER — Encounter (HOSPITAL_COMMUNITY)
Admission: RE | Disposition: A | Discharge status: Home / Self Care | Payer: Self-pay | Source: Ambulatory Visit | Attending: General Surgery

## 2016-02-10 ENCOUNTER — Encounter (HOSPITAL_COMMUNITY): Payer: Self-pay | Admitting: Surgery

## 2016-02-10 ENCOUNTER — Ambulatory Visit (HOSPITAL_COMMUNITY): Payer: Managed Care, Other (non HMO) | Admitting: Certified Registered Nurse Anesthetist

## 2016-02-10 DIAGNOSIS — K429 Umbilical hernia without obstruction or gangrene: Secondary | ICD-10-CM | POA: Diagnosis not present

## 2016-02-10 DIAGNOSIS — Z793 Long term (current) use of hormonal contraceptives: Secondary | ICD-10-CM | POA: Insufficient documentation

## 2016-02-10 DIAGNOSIS — E0590 Thyrotoxicosis, unspecified without thyrotoxic crisis or storm: Secondary | ICD-10-CM | POA: Insufficient documentation

## 2016-02-10 DIAGNOSIS — K8010 Calculus of gallbladder with chronic cholecystitis without obstruction: Secondary | ICD-10-CM | POA: Diagnosis not present

## 2016-02-10 DIAGNOSIS — Z419 Encounter for procedure for purposes other than remedying health state, unspecified: Secondary | ICD-10-CM

## 2016-02-10 DIAGNOSIS — Z79899 Other long term (current) drug therapy: Secondary | ICD-10-CM | POA: Insufficient documentation

## 2016-02-10 DIAGNOSIS — Z85850 Personal history of malignant neoplasm of thyroid: Secondary | ICD-10-CM | POA: Insufficient documentation

## 2016-02-10 DIAGNOSIS — K8020 Calculus of gallbladder without cholecystitis without obstruction: Secondary | ICD-10-CM | POA: Diagnosis present

## 2016-02-10 HISTORY — PX: CHOLECYSTECTOMY: SHX55

## 2016-02-10 SURGERY — LAPAROSCOPIC CHOLECYSTECTOMY WITH INTRAOPERATIVE CHOLANGIOGRAM
Anesthesia: General | Site: Abdomen

## 2016-02-10 MED ORDER — ONDANSETRON HCL 4 MG/2ML IJ SOLN
INTRAMUSCULAR | Status: DC | PRN
  Administered 2016-02-10 (×2): 4 mg via INTRAVENOUS

## 2016-02-10 MED ORDER — ROCURONIUM BROMIDE 50 MG/5ML IV SOLN
INTRAVENOUS | Status: AC
  Filled 2016-02-10: qty 1

## 2016-02-10 MED ORDER — ONDANSETRON HCL 4 MG/2ML IJ SOLN
INTRAMUSCULAR | Status: AC
  Filled 2016-02-10: qty 2

## 2016-02-10 MED ORDER — HYDROMORPHONE HCL 1 MG/ML IJ SOLN
0.2500 mg | INTRAMUSCULAR | Status: DC | PRN
  Administered 2016-02-10 (×2): 0.5 mg via INTRAVENOUS

## 2016-02-10 MED ORDER — MIDAZOLAM HCL 2 MG/2ML IJ SOLN
INTRAMUSCULAR | Status: AC
  Filled 2016-02-10: qty 2

## 2016-02-10 MED ORDER — LACTATED RINGERS IV SOLN
INTRAVENOUS | Status: DC
  Administered 2016-02-10: 13:00:00 via INTRAVENOUS

## 2016-02-10 MED ORDER — DEXAMETHASONE SODIUM PHOSPHATE 4 MG/ML IJ SOLN
INTRAMUSCULAR | Status: AC
  Filled 2016-02-10: qty 2

## 2016-02-10 MED ORDER — SODIUM CHLORIDE 0.9 % IV SOLN
INTRAVENOUS | Status: DC | PRN
  Administered 2016-02-10: 5 mL

## 2016-02-10 MED ORDER — PROPOFOL 10 MG/ML IV BOLUS
INTRAVENOUS | Status: DC | PRN
  Administered 2016-02-10: 150 mg via INTRAVENOUS

## 2016-02-10 MED ORDER — FENTANYL CITRATE (PF) 250 MCG/5ML IJ SOLN
INTRAMUSCULAR | Status: AC
  Filled 2016-02-10: qty 5

## 2016-02-10 MED ORDER — GLYCOPYRROLATE 0.2 MG/ML IJ SOLN
INTRAMUSCULAR | Status: AC
  Filled 2016-02-10: qty 2

## 2016-02-10 MED ORDER — MIDAZOLAM HCL 5 MG/5ML IJ SOLN
INTRAMUSCULAR | Status: DC | PRN
  Administered 2016-02-10: 2 mg via INTRAVENOUS

## 2016-02-10 MED ORDER — FENTANYL CITRATE (PF) 100 MCG/2ML IJ SOLN
INTRAMUSCULAR | Status: DC | PRN
  Administered 2016-02-10: 100 ug via INTRAVENOUS
  Administered 2016-02-10 (×5): 50 ug via INTRAVENOUS

## 2016-02-10 MED ORDER — LACTATED RINGERS IV SOLN
INTRAVENOUS | Status: DC | PRN
  Administered 2016-02-10 (×2): via INTRAVENOUS

## 2016-02-10 MED ORDER — LIDOCAINE HCL (CARDIAC) 20 MG/ML IV SOLN
INTRAVENOUS | Status: AC
  Filled 2016-02-10: qty 5

## 2016-02-10 MED ORDER — SODIUM CHLORIDE 0.9 % IR SOLN
Status: DC | PRN
  Administered 2016-02-10: 1000 mL

## 2016-02-10 MED ORDER — ROCURONIUM BROMIDE 100 MG/10ML IV SOLN
INTRAVENOUS | Status: DC | PRN
  Administered 2016-02-10: 10 mg via INTRAVENOUS
  Administered 2016-02-10: 40 mg via INTRAVENOUS

## 2016-02-10 MED ORDER — HYDROMORPHONE HCL 1 MG/ML IJ SOLN
INTRAMUSCULAR | Status: AC
  Administered 2016-02-10: 0.5 mg via INTRAVENOUS
  Filled 2016-02-10: qty 1

## 2016-02-10 MED ORDER — DEXAMETHASONE SODIUM PHOSPHATE 4 MG/ML IJ SOLN
INTRAMUSCULAR | Status: DC | PRN
  Administered 2016-02-10: 8 mg via INTRAVENOUS

## 2016-02-10 MED ORDER — BUPIVACAINE-EPINEPHRINE 0.25% -1:200000 IJ SOLN
INTRAMUSCULAR | Status: DC | PRN
  Administered 2016-02-10: 18 mL

## 2016-02-10 MED ORDER — NEOSTIGMINE METHYLSULFATE 10 MG/10ML IV SOLN
INTRAVENOUS | Status: DC | PRN
  Administered 2016-02-10: 3 mg via INTRAVENOUS

## 2016-02-10 MED ORDER — LIDOCAINE HCL (CARDIAC) 20 MG/ML IV SOLN
INTRAVENOUS | Status: DC | PRN
  Administered 2016-02-10: 60 mg via INTRAVENOUS

## 2016-02-10 MED ORDER — SCOPOLAMINE 1 MG/3DAYS TD PT72
MEDICATED_PATCH | TRANSDERMAL | Status: AC
  Administered 2016-02-10: 1 via TRANSDERMAL
  Filled 2016-02-10: qty 1

## 2016-02-10 MED ORDER — 0.9 % SODIUM CHLORIDE (POUR BTL) OPTIME
TOPICAL | Status: DC | PRN
  Administered 2016-02-10: 1000 mL

## 2016-02-10 MED ORDER — OXYCODONE-ACETAMINOPHEN 5-325 MG PO TABS: 1.0000 | ORAL_TABLET | ORAL | Status: DC | PRN

## 2016-02-10 MED ORDER — GLYCOPYRROLATE 0.2 MG/ML IJ SOLN
INTRAMUSCULAR | Status: DC | PRN
  Administered 2016-02-10: 0.4 mg via INTRAVENOUS

## 2016-02-10 SURGICAL SUPPLY — 41 items
APPLIER CLIP 5 13 M/L LIGAMAX5 (MISCELLANEOUS) ×3
BLADE SURG ROTATE 9660 (MISCELLANEOUS) IMPLANT
CANISTER SUCTION 2500CC (MISCELLANEOUS) ×3 IMPLANT
CATH REDDICK CHOLANGI 4FR 50CM (CATHETERS) ×3 IMPLANT
CHLORAPREP W/TINT 26ML (MISCELLANEOUS) ×3 IMPLANT
CLIP APPLIE 5 13 M/L LIGAMAX5 (MISCELLANEOUS) ×1 IMPLANT
COVER MAYO STAND STRL (DRAPES) ×3 IMPLANT
COVER SURGICAL LIGHT HANDLE (MISCELLANEOUS) ×3 IMPLANT
DRAPE C-ARM 42X72 X-RAY (DRAPES) ×3 IMPLANT
ELECT REM PT RETURN 9FT ADLT (ELECTROSURGICAL) ×3
ELECTRODE REM PT RTRN 9FT ADLT (ELECTROSURGICAL) ×1 IMPLANT
GLOVE BIO SURGEON STRL SZ7 (GLOVE) ×6 IMPLANT
GLOVE BIO SURGEON STRL SZ7.5 (GLOVE) ×3 IMPLANT
GLOVE BIOGEL PI IND STRL 6.5 (GLOVE) ×1 IMPLANT
GLOVE BIOGEL PI IND STRL 7.0 (GLOVE) ×2 IMPLANT
GLOVE BIOGEL PI INDICATOR 6.5 (GLOVE) ×2
GLOVE BIOGEL PI INDICATOR 7.0 (GLOVE) ×4
GLOVE ECLIPSE 7.5 STRL STRAW (GLOVE) ×3 IMPLANT
GLOVE SURG SS PI 7.0 STRL IVOR (GLOVE) ×3 IMPLANT
GOWN STRL REUS W/ TWL LRG LVL3 (GOWN DISPOSABLE) ×4 IMPLANT
GOWN STRL REUS W/ TWL XL LVL3 (GOWN DISPOSABLE) ×1 IMPLANT
GOWN STRL REUS W/TWL LRG LVL3 (GOWN DISPOSABLE) ×8
GOWN STRL REUS W/TWL XL LVL3 (GOWN DISPOSABLE) ×2
IV CATH 14GX2 1/4 (CATHETERS) ×3 IMPLANT
KIT BASIN OR (CUSTOM PROCEDURE TRAY) ×3 IMPLANT
KIT ROOM TURNOVER OR (KITS) ×3 IMPLANT
LIQUID BAND (GAUZE/BANDAGES/DRESSINGS) ×3 IMPLANT
NS IRRIG 1000ML POUR BTL (IV SOLUTION) ×3 IMPLANT
PAD ARMBOARD 7.5X6 YLW CONV (MISCELLANEOUS) ×3 IMPLANT
POUCH SPECIMEN RETRIEVAL 10MM (ENDOMECHANICALS) ×3 IMPLANT
SCISSORS LAP 5X35 DISP (ENDOMECHANICALS) ×3 IMPLANT
SET IRRIG TUBING LAPAROSCOPIC (IRRIGATION / IRRIGATOR) ×3 IMPLANT
SLEEVE ENDOPATH XCEL 5M (ENDOMECHANICALS) ×6 IMPLANT
SPECIMEN JAR SMALL (MISCELLANEOUS) ×3 IMPLANT
SUT MNCRL AB 4-0 PS2 18 (SUTURE) ×3 IMPLANT
TOWEL OR 17X24 6PK STRL BLUE (TOWEL DISPOSABLE) ×3 IMPLANT
TOWEL OR 17X26 10 PK STRL BLUE (TOWEL DISPOSABLE) ×3 IMPLANT
TRAY LAPAROSCOPIC MC (CUSTOM PROCEDURE TRAY) ×3 IMPLANT
TROCAR XCEL BLUNT TIP 100MML (ENDOMECHANICALS) ×3 IMPLANT
TROCAR XCEL NON-BLD 5MMX100MML (ENDOMECHANICALS) ×3 IMPLANT
TUBING INSUFFLATION (TUBING) ×3 IMPLANT

## 2016-02-10 NOTE — H&P (Signed)
Rose Alvarez  Location: [facility] Patient #: [id number] DOB: 25-Nov-1971 Married / Language: English / Race: White Female   History of Present Illness  Patient words: New-Gallbladder.  The patient is a 45 year old female who presents with abdominal pain. We are asked to see the patient in consultation by Dr. Elias Elseobert Alvarez to evaluate her for gallstones. The patient is a [age] year old white female who is been having epigastric pain off and on for the last year. The pain is been associated with nausea. The pain has occurred about 4-5 times in the last year. The pain lasts about an hour before resolves. She recently went for an ultrasound which did show stones in her gallbladder but no gallbladder wall thickening or ductal dilatation. Her liver functions from a year ago were normal.   Other Problems  Cholelithiasis Thyroid Cancer Thyroid Disease  Past Surgical History  Oral Surgery  Diagnostic Studies History Colonoscopy never Mammogram within last year Pap Smear 1-5 years ago  Allergies  No Known Drug Allergies01/24/2017  Medication History  Atenolol (25MG  Tablet, Oral) Active. MethIMAzole (5MG  Tablet, Oral) Active. Nortrel 7/7/7 (0.5/0.75/1-35MG -MCG Tablet, Oral) Active. Fexofenadine HCl (180MG  Tablet, Oral) Active. Flonase (50MCG/ACT Suspension, Nasal) Active. Medications Reconciled  Social History  Caffeine use Carbonated beverages, Coffee. No alcohol use No drug use Tobacco use Never smoker.  Family History Arthritis Mother. Hypertension Mother.  Pregnancy / Birth History  Age at menarche 12 years. Contraceptive History Oral contraceptives. Gravida 3 Maternal age 47-25 Para 2 Regular periods    Review of Systems  General Present- Night Sweats. Not Present- Appetite Loss, Chills, Fatigue, Fever, Weight Gain and Weight Loss. Skin Not Present- Change in Wart/Mole, Dryness, Hives, Jaundice, New Lesions,  Non-Healing Wounds, Rash and Ulcer. HEENT Not Present- Earache, Hearing Loss, Hoarseness, Nose Bleed, Oral Ulcers, Ringing in the Ears, Seasonal Allergies, Sinus Pain, Sore Throat, Visual Disturbances, Wears glasses/contact lenses and Yellow Eyes. Respiratory Not Present- Bloody sputum, Chronic Cough, Difficulty Breathing, Snoring and Wheezing. Breast Not Present- Breast Mass, Breast Pain, Nipple Discharge and Skin Changes. Cardiovascular Present- Palpitations. Not Present- Chest Pain, Difficulty Breathing Lying Down, Leg Cramps, Rapid Heart Rate, Shortness of Breath and Swelling of Extremities. Gastrointestinal Present- Abdominal Pain, Bloating and Indigestion. Not Present- Bloody Stool, Change in Bowel Habits, Chronic diarrhea, Constipation, Difficulty Swallowing, Excessive gas, Gets full quickly at meals, Hemorrhoids, Nausea, Rectal Pain and Vomiting. Female Genitourinary Not Present- Frequency, Nocturia, Painful Urination, Pelvic Pain and Urgency. Musculoskeletal Not Present- Back Pain, Joint Pain, Joint Stiffness, Muscle Pain, Muscle Weakness and Swelling of Extremities. Neurological Not Present- Decreased Memory, Fainting, Headaches, Numbness, Seizures, Tingling, Tremor, Trouble walking and Weakness. Psychiatric Not Present- Anxiety, Bipolar, Change in Sleep Pattern, Depression, Fearful and Frequent crying. Endocrine Present- Heat Intolerance and Hot flashes. Not Present- Cold Intolerance, Excessive Hunger, Hair Changes and New Diabetes. Hematology Not Present- Easy Bruising, Excessive bleeding, Gland problems, HIV and Persistent Infections.  Vitals  Weight: 158.4 lb Height: 63in Body Surface Area: 1.75 m Body Mass Index: 28.06 kg/m  Temp.: 98.50F(Oral)  Pulse: 56 (Regular)  BP: 102/60 (Sitting, Left Arm, Standard)       Physical Exam  General Mental Status-Alert. General Appearance-Consistent with stated age. Hydration-Well hydrated. Voice-Normal.  Head  and Neck Head-normocephalic, atraumatic with no lesions or palpable masses. Trachea-midline. Thyroid Gland Characteristics - normal size and consistency.  Eye Eyeball - Bilateral-Extraocular movements intact. Sclera/Conjunctiva - Bilateral-No scleral icterus.  Chest and Lung Exam Chest and lung exam reveals -quiet, even and easy respiratory  effort with no use of accessory muscles and on auscultation, normal breath sounds, no adventitious sounds and normal vocal resonance. Inspection Chest Wall - Normal. Back - normal.  Cardiovascular Cardiovascular examination reveals -normal heart sounds, regular rate and rhythm with no murmurs and normal pedal pulses bilaterally.  Abdomen Inspection Inspection of the abdomen reveals - No Hernias. Skin - Scar - no surgical scars. Palpation/Percussion Palpation and Percussion of the abdomen reveal - Soft, Non Tender, No Rebound tenderness, No Rigidity (guarding) and No hepatosplenomegaly. Auscultation Auscultation of the abdomen reveals - Bowel sounds normal.  Neurologic Neurologic evaluation reveals -alert and oriented x 3 with no impairment of recent or remote memory. Mental Status-Normal.  Musculoskeletal Normal Exam - Left-Upper Extremity Strength Normal and Lower Extremity Strength Normal. Normal Exam - Right-Upper Extremity Strength Normal and Lower Extremity Strength Normal.  Lymphatic Head & Neck  General Head & Neck Lymphatics: Bilateral - Description - Normal. Axillary  General Axillary Region: Bilateral - Description - Normal. Tenderness - Non Tender. Femoral & Inguinal  Generalized Femoral & Inguinal Lymphatics: Bilateral - Description - Normal. Tenderness - Non Tender.    Assessment & Plan  GALLSTONES (K80.20) Impression: The patient appears to have symptomatic gallstones. Because of the risk of further painful episodes and possible pancreatitis I think she would benefit from having her gallbladder  removed. I have discussed with her in detail the risks and benefits of the operation to remove the gallbladder as well as some of the technical aspects and she understands and wishes to proceed. I will plan for a laparoscopic cholecystectomy with intraoperative cholangiogram Current Plans Pt Education - Gallstones: discussed with patient and provided information.   Signed by Caleen Essex, MD

## 2016-02-10 NOTE — Anesthesia Postprocedure Evaluation (Signed)
Anesthesia Post Note  Patient: Rose Alvarez  Procedure(s) Performed: Procedure(s) (LRB): LAPAROSCOPIC CHOLECYSTECTOMY WITH INTRAOPERATIVE CHOLANGIOGRAM (N/A)  Patient location during evaluation: PACU Anesthesia Type: General Level of consciousness: awake Pain management: pain level controlled Vital Signs Assessment: post-procedure vital signs reviewed and stable Respiratory status: spontaneous breathing Cardiovascular status: stable Postop Assessment: no signs of nausea or vomiting Anesthetic complications: no    Last Vitals:  Filed Vitals:   02/10/16 1640 02/10/16 1645  BP: 105/49 105/49  Pulse: 66 64  Temp: 36.8 C   Resp: 11 14    Last Pain: There were no vitals filed for this visit.               Param Capri

## 2016-02-10 NOTE — Transfer of Care (Signed)
Immediate Anesthesia Transfer of Care Note  Patient: Rose Alvarez  Procedure(s) Performed: Procedure(s): LAPAROSCOPIC CHOLECYSTECTOMY WITH INTRAOPERATIVE CHOLANGIOGRAM (N/A)  Patient Location: PACU  Anesthesia Type:General  Level of Consciousness: awake, alert  and oriented  Airway & Oxygen Therapy: Patient Spontanous Breathing  Post-op Assessment: Report given to RN, Post -op Vital signs reviewed and stable and Patient moving all extremities X 4  Post vital signs: Reviewed and stable  Last Vitals:  Filed Vitals:   02/10/16 1229  BP: 120/43  Pulse: 77  Temp: 36.7 C  Resp: 20    Complications: No apparent anesthesia complications

## 2016-02-10 NOTE — Anesthesia Procedure Notes (Signed)
Procedure Name: Intubation Date/Time: 02/10/2016 2:24 PM Performed by: Rise PatienceBELL, Maleka Contino T Pre-anesthesia Checklist: Patient identified, Emergency Drugs available, Suction available and Patient being monitored Patient Re-evaluated:Patient Re-evaluated prior to inductionOxygen Delivery Method: Circle system utilized Preoxygenation: Pre-oxygenation with 100% oxygen Intubation Type: IV induction Ventilation: Mask ventilation without difficulty Laryngoscope Size: Miller and 2 Grade View: Grade I Tube type: Oral Tube size: 7.0 mm Number of attempts: 1 Airway Equipment and Method: Stylet Placement Confirmation: ETT inserted through vocal cords under direct vision,  positive ETCO2 and breath sounds checked- equal and bilateral Secured at: 21 cm Tube secured with: Tape Dental Injury: Teeth and Oropharynx as per pre-operative assessment

## 2016-02-10 NOTE — Interval H&P Note (Signed)
History and Physical Interval Note:  02/10/2016 1:32 PM  Rose Alvarez  has presented today for surgery, with the diagnosis of gallstones  The various methods of treatment have been discussed with the patient and family. After consideration of risks, benefits and other options for treatment, the patient has consented to  Procedure(s): LAPAROSCOPIC CHOLECYSTECTOMY WITH INTRAOPERATIVE CHOLANGIOGRAM (N/A) as a surgical intervention .  The patient's history has been reviewed, patient examined, no change in status, stable for surgery.  I have reviewed the patient's chart and labs.  Questions were answered to the patient's satisfaction.     TOTH III,Blain Hunsucker S

## 2016-02-10 NOTE — Anesthesia Preprocedure Evaluation (Addendum)
Anesthesia Evaluation  Patient identified by MRN, date of birth, ID band Patient awake    Reviewed: Allergy & Precautions, H&P , NPO status , Patient's Chart, lab work & pertinent test results  History of Anesthesia Complications (+) PONV and history of anesthetic complications  Airway Mallampati: II  TM Distance: >3 FB Neck ROM: Full    Dental no notable dental hx. (+) Dental Advisory Given, Teeth Intact   Pulmonary neg pulmonary ROS,    Pulmonary exam normal breath sounds clear to auscultation       Cardiovascular negative cardio ROS   Rhythm:Regular Rate:Normal     Neuro/Psych  Headaches, negative psych ROS   GI/Hepatic negative GI ROS, Neg liver ROS,   Endo/Other  Hyperthyroidism   Renal/GU negative Renal ROS  negative genitourinary   Musculoskeletal  (+) Arthritis , Osteoarthritis,    Abdominal   Peds  Hematology negative hematology ROS (+)   Anesthesia Other Findings   Reproductive/Obstetrics negative OB ROS                           Anesthesia Physical Anesthesia Plan  ASA: II  Anesthesia Plan: General   Post-op Pain Management:    Induction: Intravenous  Airway Management Planned: Oral ETT  Additional Equipment:   Intra-op Plan:   Post-operative Plan: Extubation in OR  Informed Consent: I have reviewed the patients History and Physical, chart, labs and discussed the procedure including the risks, benefits and alternatives for the proposed anesthesia with the patient or authorized representative who has indicated his/her understanding and acceptance.   Dental advisory given  Plan Discussed with: CRNA, Anesthesiologist and Surgeon  Anesthesia Plan Comments:         Anesthesia Quick Evaluation

## 2016-02-10 NOTE — Op Note (Addendum)
02/10/2016  3:39 PM  PATIENT:  Rose Alvarez  45 y.o. female  PRE-OPERATIVE DIAGNOSIS:  Cholelithiasis  POST-OPERATIVE DIAGNOSIS:  Cholelithiasis  PROCEDURE:  Procedure(s): LAPAROSCOPIC CHOLECYSTECTOMY WITH INTRAOPERATIVE CHOLANGIOGRAM (N/A)  Liver biopsy Umbilical hernia repair  SURGEON:  Surgeon(s) and Role:    * Griselda MinerPaul Toth III, MD - Primary  PHYSICIAN ASSISTANT:   ASSISTANTS: Magnus IvanSheila Bowman, RNFA   ANESTHESIA:   general  EBL:  Total I/O In: 1000 [I.V.:1000] Out: 30 [Blood:30]  BLOOD ADMINISTERED:none  DRAINS: none   LOCAL MEDICATIONS USED:  MARCAINE     SPECIMEN:  Source of Specimen:  gallbladder and liver biopsy  DISPOSITION OF SPECIMEN:  PATHOLOGY  COUNTS:  YES  TOURNIQUET:  * No tourniquets in log *  DICTATION: .Dragon Dictation   Procedure: After informed consent was obtained the patient was brought to the operating room and placed in the supine position on the operating room table. After adequate induction of general anesthesia the patient's abdomen was prepped with ChloraPrep allowed to dry and draped in usual sterile manner. The area below the umbilicus was infiltrated with quarter percent  Marcaine. A small incision was made with a 15 blade knife. The incision was carried down through the subcutaneous tissue bluntly with a hemostat and [employer] retractors. The linea alba was identified. The linea alba was incised with a 15 blade knife and each side was grasped with Coker clamps. There was a small umbilical hernia identified. The incision in the fascia was made through the hernia. The preperitoneal space was then probed with a hemostat until the peritoneum was opened and access was gained to the abdominal cavity. A 0 Vicryl pursestring stitch was placed in the fascia surrounding the opening. A Hassan cannula was then placed through the opening and anchored in place with the previously placed Vicryl purse string stitch. The abdomen was insufflated with carbon  dioxide without difficulty. A laparoscope was [facility] in the right upper quadrant was inspected. Next the epigastric region was infiltrated with % Marcaine. A small incision was made with a 15 blade knife. A 5 mm port was placed bluntly through this incision into the abdominal cavity under direct vision. Next 2 sites were chosen laterally on the right side of the abdomen for placement of 5 mm ports. Each of these areas was infiltrated with quarter percent Marcaine. Small stab incisions were made with a 15 blade knife. 5 mm ports were then placed bluntly through these incisions into the abdominal cavity under direct vision without difficulty. A blunt grasper was placed through the lateralmost 5 mm port and used to grasp the dome of the gallbladder and elevated anteriorly and superiorly. Another blunt grasper was placed through the other 5 mm port and used to retract the body and neck of the gallbladder. A dissector was placed through the epigastric port and using the electrocautery the peritoneal reflection at the gallbladder neck was opened. Blunt dissection was then carried out in this area until the gallbladder neck-cystic duct junction was readily identified and a good window was created. A single clip was placed on the gallbladder neck. A small  ductotomy was made just below the clip with laparoscopic scissors. A 14-gauge Angiocath was then placed through the anterior abdominal wall under direct vision. A Reddick cholangiogram catheter was then placed through the Angiocath and flushed. The catheter was then placed in the cystic duct and anchored in place with a clip. A cholangiogram was obtained that showed no filling defects, good  emptying into the duodenum, and a short cystic duct. The anchoring clip and catheters were then removed from the patient. 2 clips were placed proximally on the cystic duct and the duct was divided between the 2 sets of clips. Posterior to this the cystic  artery was identified and again dissected bluntly in a circumferential manner until a good window  was created. 2 clips were placed proximally and one distally on the artery and the artery was divided between the 2 sets of clips. Next a laparoscopic hook cautery device was used to separate the gallbladder from the liver bed. Prior to completely detaching the gallbladder from the liver bed the liver bed was inspected and several small bleeding points were coagulated with the electrocautery until the area was completely hemostatic. The gallbladder was then detached the rest of it from the liver bed without difficulty. A laparoscopic bag was inserted through the hassan port. The laparoscope was moved to the epigastric port. The gallbladder was placed within the bag and the bag was sealed.  The bag with the gallbladder was then removed with the [facility] cannula through the infraumbilical port without difficulty. The fascial defect was then closed with the previously placed Vicryl pursestring stitch as well as with another figure-of-eight 0 Vicryl stitch. This repaired the umbilical hernia primarily.  The liver bed was inspected again and found to be hemostatic. The abdomen was irrigated with copious amounts of saline until the effluent was clear. There was a small hard implant on the edge of the liver just medial to the dome of the gallbladder. This was grasped with a blunt grasper and removed. The liver bed at this site was then fulgurated with the cautery until the area was completely hemostatic.  No other abnormalities were noted on general inspection of the abdomen.  The ports were then removed under direct vision without difficulty and were found to be hemostatic. The gas was allowed to escape. The skin incisions were all closed with interrupted 4-0 Monocryl subcuticular stitches. Dermabond dressings were applied. The patient tolerated the procedure well. At the end of the case all needle sponge and instrument counts  were correct. The patient was then awakened and taken to recovery in stable condition  PLAN OF CARE: Discharge to home after PACU  PATIENT DISPOSITION:  PACU - hemodynamically stable.   Delay start of Pharmacological VTE agent (>24hrs) due to surgical blood loss or risk of bleeding: not applicable

## 2016-02-11 ENCOUNTER — Encounter (HOSPITAL_COMMUNITY): Payer: Self-pay | Admitting: General Surgery

## 2016-02-16 ENCOUNTER — Ambulatory Visit
Admission: RE | Admit: 2016-02-16 | Discharge: 2016-02-16 | Disposition: A | Discharge status: Home / Self Care | Payer: Managed Care, Other (non HMO) | Source: Ambulatory Visit | Attending: General Surgery | Admitting: General Surgery

## 2016-02-16 ENCOUNTER — Other Ambulatory Visit: Payer: Self-pay | Admitting: General Surgery

## 2016-02-16 DIAGNOSIS — Z9049 Acquired absence of other specified parts of digestive tract: Secondary | ICD-10-CM

## 2016-02-16 DIAGNOSIS — R1011 Right upper quadrant pain: Secondary | ICD-10-CM

## 2016-02-16 MED ORDER — IOPAMIDOL (ISOVUE-300) INJECTION 61%
100.0000 mL | Freq: Once | INTRAVENOUS | Status: AC | PRN
  Administered 2016-02-16: 100 mL via INTRAVENOUS

## 2016-02-16 MED ORDER — IOHEXOL 300 MG/ML  SOLN
30.0000 mL | Freq: Once | INTRAMUSCULAR | Status: AC | PRN
  Administered 2016-02-16: 30 mL via ORAL

## 2016-02-16 MED ORDER — IOHEXOL 300 MG/ML  SOLN: 30.0000 mL | Freq: Once | INTRAMUSCULAR | Status: DC | PRN

## 2016-02-17 ENCOUNTER — Ambulatory Visit (HOSPITAL_COMMUNITY)
Admission: RE | Admit: 2016-02-17 | Discharge: 2016-02-17 | Disposition: A | Discharge status: Home / Self Care | Payer: Managed Care, Other (non HMO) | Source: Ambulatory Visit | Attending: General Surgery | Admitting: General Surgery

## 2016-02-17 ENCOUNTER — Other Ambulatory Visit (HOSPITAL_COMMUNITY): Payer: Self-pay | Admitting: General Surgery

## 2016-02-17 DIAGNOSIS — Z9049 Acquired absence of other specified parts of digestive tract: Secondary | ICD-10-CM | POA: Insufficient documentation

## 2016-02-17 MED ORDER — TECHNETIUM TC 99M MEBROFENIN IV KIT
5.4300 | PACK | Freq: Once | INTRAVENOUS | Status: AC | PRN
  Administered 2016-02-17: 5.34 via INTRAVENOUS

## 2016-03-01 ENCOUNTER — Ambulatory Visit (INDEPENDENT_AMBULATORY_CARE_PROVIDER_SITE_OTHER): Payer: Managed Care, Other (non HMO) | Admitting: Internal Medicine

## 2016-03-01 ENCOUNTER — Encounter: Payer: Self-pay | Admitting: Internal Medicine

## 2016-03-01 VITALS — BP 112/64 | HR 78 | Temp 98.1°F | Resp 12 | Wt 161.0 lb

## 2016-03-01 DIAGNOSIS — E0500 Thyrotoxicosis with diffuse goiter without thyrotoxic crisis or storm: Secondary | ICD-10-CM | POA: Diagnosis not present

## 2016-03-01 LAB — TSH: TSH: 0.03 u[IU]/mL — ABNORMAL LOW (ref 0.35–4.50)

## 2016-03-01 LAB — T3, FREE: T3, Free: 3.9 pg/mL (ref 2.3–4.2)

## 2016-03-01 LAB — T4, FREE: Free T4: 1.2 ng/dL (ref 0.60–1.60)

## 2016-03-01 MED ORDER — METHIMAZOLE 5 MG PO TABS: ORAL_TABLET | ORAL | Status: DC

## 2016-03-01 NOTE — Progress Notes (Signed)
Patient ID: Rose Alvarez, female   DOB: 03/05/1971, 45 y.o.   MRN: [id number]  HPI  Rose Alvarez is a 45 y.o.-year-old female, initially referred by her PCP, Dr. Azucena Cecil, now returning for f/u for Graves ds. Last visit 4 mo ago.   She had gall bladder surgery this month >> feels better. Missed several doses on MMI (3-4) during this time.  Reviewed and addended hx:  She had a physical exam 10/23/2014 >> incidentally found to have a low TSH.  I reviewed pt's thyroid tests: Lab Results  Component Value Date   TSH 0.15* 12/15/2015   TSH 0.05* 11/02/2015   TSH 0.04* 07/02/2015   TSH 0.04* 03/23/2015   TSH 0.10* 01/27/2015   TSH 0.22* 12/01/2014   TSH 0.01* 11/03/2014   FREET4 1.17 12/15/2015   FREET4 1.05 11/02/2015   FREET4 1.44 07/02/2015   FREET4 3.07* 03/23/2015   FREET4 2.26* 01/27/2015   FREET4 1.19 12/01/2014  11/03/2014: TSH <0.01; free T4 1.16 (0.61-1.12); total T3 163 (71-190) 10/23/2014: TSH <0.01  We checked a thyroid Uptake and Scan (02/17/2015) >> c/w Graves ds. Elevated 24 hour radio iodine uptake of 54% with homogeneous activity consistent with diffuse toxic goiter (Graves disease).  We started MMI, then increased dose to 10 mg in am and 5 mg in pm, then 20 mg once a day >> now 10 mg bid.   She continues her Atenolol 25 mg daily.  Pt denies feeling nodules in neck, hoarseness, dysphagia/odynophagia, SOB with lying down.  She c/o: - + hot flushes - better - improved fatigue - resolved chest tightness, no palpitations, no SOB - still has L eye swelling, no blurry vision >> saw ophthalmologist >> allergy or thyroid ds.  - no mm aches - no tremors - no anxiety - no weight loss/gain  She saw ophthalmologist for L eye swelling >> was told it is either 2/2 allergies or thyroid ds. (before her Graves dx.)  Records from PCP: - 10/23/2014: CBC with diff: normal; CMP: normal; Lipid panel: 158/55/53/94  ROS: Constitutional: + see HPI Eyes: no blurry  vision, + L lower eyelid swelling, no xerophthalmia ENT: no sore throat, no nodules palpated in throat, no dysphagia/odynophagia, no hoarseness Cardiovascular: no CP/SOB/palpitations/leg swelling Respiratory: no cough/SOB Gastrointestinal: no N/V/D/C Musculoskeletal:no muscle/joint aches Skin: no rashes, no easy bruising Neurological: no tremors/numbness/tingling/dizziness  I reviewed pt's medications, allergies, PMH, social hx, family hx, and changes were documented in the history of present illness. Otherwise, unchanged from my initial visit note.  Past Medical History  Diagnosis Date  . Thyroid disease     Hyperthyroidism, takes methimazole daily  . PONV (postoperative nausea and vomiting)   . Headache   . Arthritis     Bil hands  . Heart palpitations     takes atenolol daily  . Seasonal allergies     takes allegra daily, flonase as needed   Past Surgical History  Procedure Laterality Date  . Dilation and evacuation    . Wisdom tooth extraction    . Cholecystectomy N/A 02/10/2016    Procedure: LAPAROSCOPIC CHOLECYSTECTOMY WITH INTRAOPERATIVE CHOLANGIOGRAM;  Surgeon: Chevis Pretty III, MD;  Location: [facility] OR;  Service: General;  Laterality: N/A;   History   Social History  . 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 Smoking status: Never Smoker   . Smokeless tobacco: No  .  Alcohol Use: No  . Drug Use: No   Current Outpatient Rx  Name  Route  Sig  Dispense  Refill  . atenolol (TENORMIN) 25 MG tablet      TAKE 1 TABLET (25 MG TOTAL) BY MOUTH DAILY.   30 tablet   2   . fexofenadine (ALLEGRA) 180 MG tablet   Oral   Take 180 mg by mouth daily.         . fluticasone (FLONASE) 50 MCG/ACT nasal spray   Each Nare   Place 2 sprays into both nostrils daily.         . methimazole (TAPAZOLE) 5 MG tablet   Oral   Take 2 tablets (10 mg total) by mouth 2 (two) times daily.   120  tablet   1   . naproxen sodium (ANAPROX) 220 MG tablet   Oral   Take 440 mg by mouth daily as needed (for pain).         Doyce Para. NORTREL 7/7/7 0.5/0.75/1-35 MG-MCG tablet   Oral   Take 1 tablet by mouth daily.          Marland Kitchen. oxyCODONE-acetaminophen (ROXICET) 5-325 MG tablet   Oral   Take 1-2 tablets by mouth every 4 (four) hours as needed.   50 tablet   0    No Known Allergies   Family History  Problem Relation Age of Onset  . Hypertension Brother   . Diabetes Maternal Grandmother   . Heart disease Maternal Grandfather 56    Heart attack  . Heart disease Paternal Grandfather [id number]    Heart Attack   PE: BP 112/64 mmHg  Pulse 78  Temp(Src) 98.1 F (36.7 C) (Oral)  Resp 12  Wt 161 lb (73.029 kg)  SpO2 97%  LMP 02/09/2016 Body mass index is 28.53 kg/(m^2). Wt Readings from Last 3 Encounters:  03/01/16 161 lb (73.029 kg)  02/04/16 161 lb 8 oz (73.256 kg)  11/02/15 158 lb (71.668 kg)   Constitutional: normal weight, in NAD Eyes: PERRLA, EOMI, no exophthalmos, no lid lag, no stare; L lower eyelid more swollen than R and L upper eyelid with mild ptosis ENT: moist mucous membranes, no thyromegaly, L thyroid fullness, no cervical lymphadenopathy Cardiovascular: RRR, No MRG Respiratory: CTA B Gastrointestinal: abdomen soft, NT, ND, BS+ Musculoskeletal: no deformities, strength intact in all 4 Skin: moist, warm, no rashes Neurological: no tremor with outstretched hands, DTR normal in all 4  ASSESSMENT: 1. Graves ds  02/17/2015: Thyroid Uptake and scan: Elevated 24 hour radio iodine uptake of 54% with homogeneous activity consistent with diffuse toxic goiter (Graves disease).  2. Possible L thyroid nodule  PLAN:  1. Patient with Graves ds.,on MMI 20 mg daily split in 2 doses. She feels better, with less fatigue, hot flushes, no palpitations or other possible hyperthyroid sxs  - For now, will continue MMI 20 mg daily, split in 2 daily doses. She missed few doses at the  beginning of the month. - she is aware of poss. SEs from methimazole >> she did not experience any - will check the TSH, fT3 and fT4 today >> if normal, we may stop Atenolol - we will need to do thyroid ultrasound after Graves ds. resolves (for the L thyroid fullness) - she saw ophthalmology Re: L eye swelling and ptosis >> this is considered to be either from allergies or from her Graves ds. He did not deem a CT head necessary for now. - RTC in 4 months, but likely sooner  for repeat labs  Needs refills of MMI.  Office Visit on 03/01/2016  Component Date Value Ref Range Status  . T3, Free 03/01/2016 3.9  2.3 - 4.2 pg/mL Final  . Free T4 03/01/2016 1.20  0.60 - 1.60 ng/dL Final  . TSH 16/09/9603 0.03* 0.35 - 4.50 uIU/mL Final   Component     Latest Ref Rng 03/01/2016  TSH     0.35 - 4.50 uIU/mL 0.03 (L)  Triiodothyronine,Free,Serum     2.3 - 4.2 pg/mL 3.9  T4,Free(Direct)     0.60 - 1.60 ng/dL 5.40   The TSH remains low, therefore, I would suggest to increase her methimazole to 15 mg (3 tablets) in a.m. and continue with 10 mg in p.m. We'll need to repeat her TFTs in 5-6 weeks.

## 2016-03-01 NOTE — Patient Instructions (Signed)
Please continue Methimazole 10 mg 2x a day for now.  Please stop at the lab.  Please come back for a follow-up appointment in 4 months.

## 2016-03-08 ENCOUNTER — Other Ambulatory Visit: Payer: Self-pay | Admitting: Internal Medicine

## 2016-04-08 ENCOUNTER — Other Ambulatory Visit (INDEPENDENT_AMBULATORY_CARE_PROVIDER_SITE_OTHER): Payer: Managed Care, Other (non HMO)

## 2016-04-08 DIAGNOSIS — E0500 Thyrotoxicosis with diffuse goiter without thyrotoxic crisis or storm: Secondary | ICD-10-CM

## 2016-04-08 LAB — TSH: TSH: 0.08 u[IU]/mL — ABNORMAL LOW (ref 0.35–4.50)

## 2016-04-08 LAB — T3, FREE: T3 FREE: 3.3 pg/mL (ref 2.3–4.2)

## 2016-04-08 LAB — T4, FREE: FREE T4: 0.71 ng/dL (ref 0.60–1.60)

## 2016-05-04 ENCOUNTER — Other Ambulatory Visit: Payer: Self-pay | Admitting: Internal Medicine

## 2016-05-06 ENCOUNTER — Other Ambulatory Visit: Payer: Self-pay | Admitting: Internal Medicine

## 2016-05-25 ENCOUNTER — Other Ambulatory Visit: Payer: Self-pay | Admitting: Obstetrics and Gynecology

## 2016-05-26 LAB — CYTOLOGY - PAP

## 2016-07-01 ENCOUNTER — Ambulatory Visit: Payer: Managed Care, Other (non HMO) | Admitting: Internal Medicine

## 2016-07-05 ENCOUNTER — Encounter: Payer: Self-pay | Admitting: Internal Medicine

## 2016-07-05 ENCOUNTER — Ambulatory Visit (INDEPENDENT_AMBULATORY_CARE_PROVIDER_SITE_OTHER): Payer: Managed Care, Other (non HMO) | Admitting: Internal Medicine

## 2016-07-05 VITALS — BP 110/60 | HR 75 | Ht 63.5 in | Wt 168.0 lb

## 2016-07-05 DIAGNOSIS — E0500 Thyrotoxicosis with diffuse goiter without thyrotoxic crisis or storm: Secondary | ICD-10-CM | POA: Diagnosis not present

## 2016-07-05 LAB — T3, FREE: T3 FREE: 3.1 pg/mL (ref 2.3–4.2)

## 2016-07-05 LAB — T4, FREE: Free T4: 0.57 ng/dL — ABNORMAL LOW (ref 0.60–1.60)

## 2016-07-05 LAB — TSH: TSH: 5.76 u[IU]/mL — ABNORMAL HIGH (ref 0.35–4.50)

## 2016-07-05 MED ORDER — METHIMAZOLE 5 MG PO TABS: ORAL_TABLET | ORAL | 2 refills | Status: DC

## 2016-07-05 NOTE — Progress Notes (Signed)
Patient ID: Rose Alvarez, female   DOB: 1971/02/14, 45 y.o.   MRN: [id number]  HPI  Rose Alvarez is a 45 y.o.-year-old female, initially referred by her PCP, Dr. Azucena Cecil, now returning for f/u for Graves ds. Last visit 3 mo ago.   Reviewed and addended hx:  She had a physical exam 10/23/2014 >> incidentally found to have a low TSH.  I reviewed pt's thyroid tests: Lab Results  Component Value Date   TSH 0.08 (L) 04/08/2016   TSH 0.03 (L) 03/01/2016   TSH 0.15 (L) 12/15/2015   TSH 0.05 (L) 11/02/2015   TSH 0.04 (L) 07/02/2015   TSH 0.04 (L) 03/23/2015   TSH 0.10 (L) 01/27/2015   TSH 0.22 (L) 12/01/2014   TSH 0.01 (A) 11/03/2014   FREET4 0.71 04/08/2016   FREET4 1.20 03/01/2016   FREET4 1.17 12/15/2015   FREET4 1.05 11/02/2015   FREET4 1.44 07/02/2015   FREET4 3.07 (H) 03/23/2015   FREET4 2.26 (H) 01/27/2015   FREET4 1.19 12/01/2014  11/03/2014: TSH <0.01; free T4 1.16 (0.61-1.12); total T3 163 (71-190) 10/23/2014: TSH <0.01  We checked a thyroid Uptake and Scan (02/17/2015) >> c/w Graves ds. Elevated 24 hour radio iodine uptake of 54% with homogeneous activity consistent with diffuse toxic goiter (Graves disease).  We started MMI, then increased dose >> now on 15 mg in am and 10 mg in pm >> next labs were better >> we cont'd same dose in 04/2016.  She continues her Atenolol 25 mg daily.  Pt denies feeling nodules in neck, hoarseness, dysphagia/odynophagia, SOB with lying down.  She c/o: - resolved hot flushes - + weight gain - improved fatigue - resolved chest tightness, no palpitations, no SOB - still has L eye swelling, no blurry vision >> saw ophthalmologist >> allergy or thyroid ds.  - no mm aches - no tremors - no anxiety  She saw ophthalmologist for L eye swelling >> was told it is either 2/2 allergies or thyroid ds. (before her Graves dx.)  Records from PCP: - 10/23/2014: CBC with diff: normal; CMP: normal; Lipid panel:  158/55/53/94  ROS: Constitutional: + see HPI Eyes: no blurry vision, + improved L lower eyelid swelling, no xerophthalmia ENT: no sore throat, no nodules palpated in throat, no dysphagia/odynophagia, no hoarseness Cardiovascular: no CP/SOB/palpitations/+ leg swelling Respiratory: no cough/SOB Gastrointestinal: no N/V/D/C Musculoskeletal:no muscle/joint aches Skin: no rashes, no easy bruising Neurological: no tremors/numbness/tingling/dizziness  I reviewed pt's medications, allergies, PMH, social hx, family hx, and changes were documented in the history of present illness. Otherwise, unchanged from my initial visit note.  Past Medical History:  Diagnosis Date  . Arthritis    Bil hands  . Headache   . Heart palpitations    takes atenolol daily  . PONV (postoperative nausea and vomiting)   . Seasonal allergies    takes allegra daily, flonase as needed  . Thyroid disease    Hyperthyroidism, takes methimazole daily   Past Surgical History:  Procedure Laterality Date  . CHOLECYSTECTOMY N/A 02/10/2016   Procedure: LAPAROSCOPIC CHOLECYSTECTOMY WITH INTRAOPERATIVE CHOLANGIOGRAM;  Surgeon: Chevis Pretty III, MD;  Location: [facility] OR;  Service: General;  Laterality: N/A;  . DILATION AND EVACUATION    . WISDOM TOOTH EXTRACTION     History   Social History  . 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 Smoking status: Never  Smoker   . Smokeless tobacco: No  . Alcohol Use: No  . Drug Use: No   Current Outpatient Prescriptions on File Prior to Visit  Medication Sig Dispense Refill  . atenolol (TENORMIN) 25 MG tablet TAKE 1 TABLET (25 MG TOTAL) BY MOUTH DAILY. 30 tablet 2  . fexofenadine (ALLEGRA) 180 MG tablet Take 180 mg by mouth daily.    . fluticasone (FLONASE) 50 MCG/ACT nasal spray Place 2 sprays into both nostrils daily.    . methimazole (TAPAZOLE) 5 MG tablet Take by mouth 15 mg in a.m. and 10  mg in p.m. 200 tablet 2  . naproxen sodium (ANAPROX) 220 MG tablet Take 440 mg by mouth daily as needed (for pain).    Doyce Para 7/7/7 0.5/0.75/1-35 MG-MCG tablet Take 1 tablet by mouth daily.      No current facility-administered medications on file prior to visit.     No Known Allergies   Family History  Problem Relation Age of Onset  . Hypertension Brother   . Diabetes Maternal Grandmother   . Heart disease Maternal Grandfather 56    Heart attack  . Heart disease Paternal Grandfather 101    Heart Attack   PE: BP 110/60 (BP Location: Left Arm, Patient Position: Sitting)   Pulse 75   Ht 5' 3.5" (1.613 m)   Wt 168 lb (76.2 kg)   LMP 05/30/2016   SpO2 97%   BMI 29.29 kg/m  Body mass index is 29.29 kg/m. Wt Readings from Last 3 Encounters:  07/05/16 168 lb (76.2 kg)  03/01/16 161 lb (73 kg)  02/04/16 161 lb 8 oz (73.3 kg)   Constitutional: + overweight, in NAD Eyes: PERRLA, EOMI, no exophthalmos, no lid lag, no stare; L lower eyelid more swollen than R and L upper eyelid with mild ptosis ENT: moist mucous membranes, no thyromegaly, L thyroid fullness, no cervical lymphadenopathy Cardiovascular: RRR, No MRG Respiratory: CTA B Gastrointestinal: abdomen soft, NT, ND, BS+ Musculoskeletal: no deformities, strength intact in all 4 Skin: moist, warm, no rashes Neurological: no tremor with outstretched hands, DTR normal in all 4  ASSESSMENT: 1. Graves ds  02/17/2015: Thyroid Uptake and scan: Elevated 24 hour radio iodine uptake of 54% with homogeneous activity consistent with diffuse toxic goiter (Graves disease).  2. Possible L thyroid nodule  PLAN:  1. Patient with Graves ds.,on MMI 15 mg in am and 10 mg in pm. She feels better, with no  fatigue, hot flushes, palpitations or other possible hyperthyroid sxs  - For now, will continue this dose of MMI  - she is aware of poss. SEs from methimazole >> she did not experience any - will check the TSH, fT3 and fT4 today >> if  normal, we may stop Atenolol and decrease MMI  - we discussed about the possibility of needing RAI tx >> she agrees - we will need to do thyroid ultrasound after Graves ds. resolves (for the L thyroid fullness) - she saw ophthalmology Re: L eye swelling and ptosis >> this is considered to be either from allergies or from her Graves ds. He did not deem a CT head necessary for now. She feels this is improving - RTC in 6 months, but sooner for repeat labs  Component     Latest Ref Rng & Units 07/05/2016  TSH     0.35 - 4.50 uIU/mL 5.76 (H)  Triiodothyronine,Free,Serum     2.3 - 4.2 pg/mL 3.1  T4,Free(Direct)     0.60 - 1.60 ng/dL  0.57 (L)   Patient slightly hypothyroid now >> we can decrease the methimazole to 10 mg in a.m. and 10 mg in p.m. We will need to repeat the labs in 5 weeks.  Carlus Pavlov, MD PhD [facility]

## 2016-07-05 NOTE — Patient Instructions (Signed)
Please stop at the lab.  For now, continue Methimazole 15 mg in am and 10 mg in pm.  Continue Atenolol 25 mg daily.  Please come back for a follow-up appointment in 6 months.

## 2016-07-26 ENCOUNTER — Other Ambulatory Visit: Payer: Self-pay | Admitting: Internal Medicine

## 2016-08-10 ENCOUNTER — Other Ambulatory Visit: Payer: Self-pay | Admitting: Internal Medicine

## 2016-08-11 ENCOUNTER — Other Ambulatory Visit: Payer: Self-pay | Admitting: Internal Medicine

## 2016-08-24 ENCOUNTER — Other Ambulatory Visit (INDEPENDENT_AMBULATORY_CARE_PROVIDER_SITE_OTHER): Payer: Managed Care, Other (non HMO)

## 2016-08-24 ENCOUNTER — Encounter: Payer: Self-pay | Admitting: Internal Medicine

## 2016-08-24 DIAGNOSIS — E0500 Thyrotoxicosis with diffuse goiter without thyrotoxic crisis or storm: Secondary | ICD-10-CM | POA: Diagnosis not present

## 2016-08-24 LAB — T4, FREE: FREE T4: 0.65 ng/dL (ref 0.60–1.60)

## 2016-08-24 LAB — T3, FREE: T3 FREE: 2.8 pg/mL (ref 2.3–4.2)

## 2016-08-24 LAB — TSH: TSH: 2.99 u[IU]/mL (ref 0.35–4.50)

## 2016-08-25 ENCOUNTER — Other Ambulatory Visit: Payer: Self-pay

## 2016-08-25 DIAGNOSIS — E0500 Thyrotoxicosis with diffuse goiter without thyrotoxic crisis or storm: Secondary | ICD-10-CM

## 2016-08-25 MED ORDER — METHIMAZOLE 5 MG PO TABS
ORAL_TABLET | ORAL | 0 refills | Status: DC
Start: 2016-08-25 — End: 2017-01-31

## 2016-08-25 NOTE — Telephone Encounter (Signed)
Called patient and gave lab results. Patient had no questions or concerns and got patient scheduled for lab appointment.

## 2016-10-07 ENCOUNTER — Other Ambulatory Visit: Payer: Self-pay

## 2016-10-07 MED ORDER — ATENOLOL 25 MG PO TABS: ORAL_TABLET | ORAL | 0 refills | Status: DC

## 2016-10-13 ENCOUNTER — Other Ambulatory Visit: Payer: Managed Care, Other (non HMO)

## 2016-10-19 ENCOUNTER — Other Ambulatory Visit (INDEPENDENT_AMBULATORY_CARE_PROVIDER_SITE_OTHER): Payer: Managed Care, Other (non HMO)

## 2016-10-19 DIAGNOSIS — E0500 Thyrotoxicosis with diffuse goiter without thyrotoxic crisis or storm: Secondary | ICD-10-CM

## 2016-10-19 LAB — TSH: TSH: 0.7 u[IU]/mL (ref 0.35–4.50)

## 2016-10-19 LAB — T4, FREE: FREE T4: 0.66 ng/dL (ref 0.60–1.60)

## 2016-10-19 LAB — T3, FREE: T3 FREE: 2.8 pg/mL (ref 2.3–4.2)

## 2017-01-05 ENCOUNTER — Ambulatory Visit: Payer: Managed Care, Other (non HMO) | Admitting: Internal Medicine

## 2017-01-30 ENCOUNTER — Other Ambulatory Visit: Payer: Self-pay | Admitting: Internal Medicine

## 2017-01-31 ENCOUNTER — Encounter: Payer: Self-pay | Admitting: Internal Medicine

## 2017-01-31 ENCOUNTER — Ambulatory Visit (INDEPENDENT_AMBULATORY_CARE_PROVIDER_SITE_OTHER): Payer: Managed Care, Other (non HMO) | Admitting: Internal Medicine

## 2017-01-31 VITALS — BP 124/80 | HR 89 | Wt 181.0 lb

## 2017-01-31 DIAGNOSIS — E0500 Thyrotoxicosis with diffuse goiter without thyrotoxic crisis or storm: Secondary | ICD-10-CM | POA: Diagnosis not present

## 2017-01-31 LAB — T4, FREE: Free T4: 1.22 ng/dL (ref 0.60–1.60)

## 2017-01-31 LAB — TSH: TSH: 0.02 u[IU]/mL — ABNORMAL LOW (ref 0.35–4.50)

## 2017-01-31 LAB — T3, FREE: T3 FREE: 4.6 pg/mL — AB (ref 2.3–4.2)

## 2017-01-31 MED ORDER — METHIMAZOLE 5 MG PO TABS: ORAL_TABLET | ORAL | 0 refills | Status: DC

## 2017-01-31 NOTE — Progress Notes (Signed)
Patient ID: Rose Alvarez, female   DOB: Oct 07, 1971, 46 y.o.   MRN: [id number]  HPI  Rose Alvarez is a 46 y.o.-year-old female, initially referred by her PCP, Dr. Azucena CecilSwayne, now returning for f/u for Graves ds. Last visit 6 mo ago.   Reviewed and addended hx:  She had a physical exam 10/23/2014 >> incidentally found to have a low TSH.  We checked a thyroid Uptake and Scan (02/17/2015) >> c/w Graves ds. Elevated 24 hour radio iodine uptake of 54% with homogeneous activity consistent with diffuse toxic goiter (Graves disease).  Graves Ab's were also elevated: Component     Latest Ref Rng & Units 07/02/2015  TSI     <140 % baseline 363 (H)   I reviewed pt's thyroid tests: Lab Results  Component Value Date   TSH 0.70 10/19/2016   TSH 2.99 08/24/2016   TSH 5.76 (H) 07/05/2016   TSH 0.08 (L) 04/08/2016   TSH 0.03 (L) 03/01/2016   TSH 0.15 (L) 12/15/2015   TSH 0.05 (L) 11/02/2015   TSH 0.04 (L) 07/02/2015   TSH 0.04 (L) 03/23/2015   TSH 0.10 (L) 01/27/2015   FREET4 0.66 10/19/2016   FREET4 0.65 08/24/2016   FREET4 0.57 (L) 07/05/2016   FREET4 0.71 04/08/2016   FREET4 1.20 03/01/2016   FREET4 1.17 12/15/2015   FREET4 1.05 11/02/2015   FREET4 1.44 07/02/2015   FREET4 3.07 (H) 03/23/2015   FREET4 2.26 (H) 01/27/2015  11/03/2014: TSH <0.01; free T4 1.16 (0.61-1.12); total T3 163 (71-190) 10/23/2014: TSH <0.01  We started MMI, then increased dose >> 15 mg in am and 10 mg in pm >> decreased to 10 mg in am and 5 mg in pm in 08/2016 >> she tells me she is actually just taking 10 mg at night as she forgets am dose.  She continues her Atenolol 25 mg daily.  Pt denies feeling nodules in neck, hoarseness, dysphagia/odynophagia, SOB with lying down.  She c/o: - improved hot flushes - + weight gain - no fatigue - resolved chest tightness, no palpitations, no SOB - still has L eye swelling, no blurry vision >> saw ophthalmologist >> allergy or thyroid ds. >> this has improved -  no tremors - no anxiety  She saw ophthalmologist for L eye swelling >> was told it is either 2/2 allergies or thyroid ds. (before her Graves dx.)  ROS: Constitutional: + see HPI Eyes: no blurry vision, + improved L lower eyelid swelling, no xerophthalmia ENT: no sore throat, no nodules palpated in throat, no dysphagia/odynophagia, no hoarseness Cardiovascular: no CP/SOB/palpitations/leg swelling Respiratory: no cough/SOB Gastrointestinal: no N/V/D/C Musculoskeletal:no muscle/joint aches Skin: no rashes, no easy bruising Neurological: no tremors/numbness/tingling/dizziness  I reviewed pt's medications, allergies, PMH, social hx, family hx, and changes were documented in the history of present illness. Otherwise, unchanged from my initial visit note.  Past Medical History:  Diagnosis Date  . Arthritis    Bil hands  . Headache   . Heart palpitations    takes atenolol daily  . PONV (postoperative nausea and vomiting)   . Seasonal allergies    takes allegra daily, flonase as needed  . Thyroid disease    Hyperthyroidism, takes methimazole daily   Past Surgical History:  Procedure Laterality Date  . CHOLECYSTECTOMY N/A 02/10/2016   Procedure: LAPAROSCOPIC CHOLECYSTECTOMY WITH INTRAOPERATIVE CHOLANGIOGRAM;  Surgeon: Chevis PrettyPaul Toth III, MD;  Location: [facility] OR;  Service: General;  Laterality: N/A;  . DILATION AND EVACUATION    . WISDOM TOOTH EXTRACTION  History   Social History  . 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 Smoking status: Never Smoker   . Smokeless tobacco: No  . Alcohol Use: No  . Drug Use: No   Current Outpatient Prescriptions on File Prior to Visit  Medication Sig Dispense Refill  . atenolol (TENORMIN) 25 MG tablet TAKE 1 TABLET (25 MG TOTAL) BY MOUTH DAILY. 30 tablet 2  . fexofenadine (ALLEGRA) 180 MG tablet Take 180 mg by mouth daily.    . fluticasone (FLONASE) 50  MCG/ACT nasal spray Place 2 sprays into both nostrils daily.    . methimazole (TAPAZOLE) 5 MG tablet Take 10 mg in the AM; 5 mg in the PM 360 tablet 0  . naproxen sodium (ANAPROX) 220 MG tablet Take 440 mg by mouth daily as needed (for pain).    Doyce Para 7/7/7 0.5/0.75/1-35 MG-MCG tablet Take 1 tablet by mouth daily.      No current facility-administered medications on file prior to visit.    No Known Allergies   Family History  Problem Relation Age of Onset  . Hypertension Brother   . Diabetes Maternal Grandmother   . Heart disease Maternal Grandfather 56    Heart attack  . Heart disease Paternal Grandfather 29    Heart Attack   PE: BP 124/80 (BP Location: Left Arm, Patient Position: Sitting)   Pulse 89   Wt 181 lb (82.1 kg)   LMP 01/24/2017   SpO2 98%   BMI 31.56 kg/m  Body mass index is 31.56 kg/m. Wt Readings from Last 3 Encounters:  01/31/17 181 lb (82.1 kg)  07/05/16 168 lb (76.2 kg)  03/01/16 161 lb (73 kg)   Constitutional: + overweight, in NAD Eyes: PERRLA, EOMI, no exophthalmos, no lid lag, no stare; L upper eyelid with mild ptosis ENT: moist mucous membranes, no thyromegaly, no cervical lymphadenopathy Cardiovascular: RRR, No MRG Respiratory: CTA B Gastrointestinal: abdomen soft, NT, ND, BS+ Musculoskeletal: no deformities, strength intact in all 4 Skin: moist, warm, no rashes Neurological: no tremor with outstretched hands, DTR normal in all 4  ASSESSMENT: 1. Graves ds  02/17/2015: Thyroid Uptake and scan: Elevated 24 hour radio iodine uptake of 54% with homogeneous activity consistent with diffuse toxic goiter (Graves disease).  PLAN:  1. Patient with Graves ds.,on MMI 10 mg daily. She denies fatigue, palpitations, tremors, but has hot flushes. She gained weight since last visit, as expected with improved thyroid status. - For now, will continue her current dose of MMI, but this may not be enough - will check the TSH, fT3 and fT4 today and will add  TSIs to check Graves activity. Will stop Atenolol and see if she develops tachycardia.  - we again discussed about the possibility of needing RAI tx >> she agrees - she previously saw ophthalmology Re: L eye swelling and ptosis >> this is considered to be either from allergies or from her Graves ds. He did not deem a CT head necessary for now. This is improving, and it is barely visible today. - RTC in 6 months, but may need to come sooner for repeat labs  Component     Latest Ref Rng & Units 01/31/2017  TSH     0.35 - 4.50 uIU/mL 0.02 (L)  Triiodothyronine,Free,Serum     2.3 - 4.2 pg/mL 4.6 (H)  T4,Free(Direct)  0.60 - 1.60 ng/dL 1.61  TSI still pending.  Her methimazole dose is not high enough. At this point, I would suggest to increase the dose by adding a 5 mg of methimazole daily, but I would also strongly suggest to have the RAI treatment. We'll need a repeat set of labs in 6 weeks.  Carlus Pavlov, MD PhD [facility]

## 2017-01-31 NOTE — Patient Instructions (Signed)
Please stop at the lab.  For now, continue Methimazole 10 mg in am.  Stop Atenolol daily. Let me know if your pulse at rest is >90  Please come back for a follow-up appointment in 6 months.

## 2017-02-01 ENCOUNTER — Encounter: Payer: Self-pay | Admitting: Internal Medicine

## 2017-02-01 MED ORDER — METHIMAZOLE 5 MG PO TABS: ORAL_TABLET | ORAL | 0 refills | Status: DC

## 2017-02-04 LAB — THYROID STIMULATING IMMUNOGLOBULIN: TSI: 320 %{baseline} — AB (ref <140)

## 2017-03-14 ENCOUNTER — Telehealth: Payer: Self-pay | Admitting: Family Medicine

## 2017-03-14 NOTE — Telephone Encounter (Signed)
Her last visit with me was in February, not in March. So yes, she needs to keep the lab appointment for tomorrow.

## 2017-03-14 NOTE — Telephone Encounter (Signed)
Please advise. Thank you

## 2017-03-14 NOTE — Telephone Encounter (Signed)
Patient would like a call back to verify whether or not she needs to keep her appt for labs tomorrow 04/11? She had some drawn on her last visit on March 27th.   Thank you,  -LL

## 2017-03-15 ENCOUNTER — Other Ambulatory Visit (INDEPENDENT_AMBULATORY_CARE_PROVIDER_SITE_OTHER): Payer: Managed Care, Other (non HMO)

## 2017-03-15 ENCOUNTER — Telehealth: Payer: Self-pay

## 2017-03-15 DIAGNOSIS — E0500 Thyrotoxicosis with diffuse goiter without thyrotoxic crisis or storm: Secondary | ICD-10-CM | POA: Diagnosis not present

## 2017-03-15 LAB — T4, FREE: Free T4: 0.82 ng/dL (ref 0.60–1.60)

## 2017-03-15 LAB — T3, FREE: T3, Free: 3.8 pg/mL (ref 2.3–4.2)

## 2017-03-15 LAB — TSH: TSH: 0.26 u[IU]/mL — ABNORMAL LOW (ref 0.35–4.50)

## 2017-03-15 NOTE — Telephone Encounter (Signed)
Called and advised patient to keep appointment for today, patient had no questions at this time.

## 2017-03-15 NOTE — Telephone Encounter (Signed)
Called and advised patient to keep appointment for today, patient had no questions at this time.  

## 2017-07-05 ENCOUNTER — Other Ambulatory Visit: Payer: Self-pay | Admitting: Internal Medicine

## 2017-07-31 ENCOUNTER — Ambulatory Visit (INDEPENDENT_AMBULATORY_CARE_PROVIDER_SITE_OTHER): Payer: Managed Care, Other (non HMO) | Admitting: Internal Medicine

## 2017-07-31 VITALS — BP 110/62 | HR 97 | Wt 167.0 lb

## 2017-07-31 DIAGNOSIS — H02402 Unspecified ptosis of left eyelid: Secondary | ICD-10-CM | POA: Diagnosis not present

## 2017-07-31 DIAGNOSIS — E0500 Thyrotoxicosis with diffuse goiter without thyrotoxic crisis or storm: Secondary | ICD-10-CM | POA: Diagnosis not present

## 2017-07-31 LAB — T4, FREE: Free T4: 0.72 ng/dL (ref 0.60–1.60)

## 2017-07-31 LAB — T3, FREE: T3, Free: 3 pg/mL (ref 2.3–4.2)

## 2017-07-31 LAB — TSH: TSH: 4.96 u[IU]/mL — ABNORMAL HIGH (ref 0.35–4.50)

## 2017-07-31 NOTE — Patient Instructions (Signed)
Please stop at the lab.  Please continue Methimazole 5 mg in am and 10 mg in pm.  Let me know if your pulse at rest gets higher than 90.  Please come back for a follow-up appointment in 6 months.

## 2017-07-31 NOTE — Progress Notes (Signed)
Patient ID: Rose Alvarez, female   DOB: Mar 10, 1971, 46 y.o.   MRN: [id number]  HPI  Rose Alvarez is a 46 y.o.-year-old female, initially referred by her PCP, Dr. Azucena Cecil, now returning for f/u for Graves ds. Last visit 6 mo ago.  Reviewed and addended hx:  She had a physical exam 10/23/2014 >> incidentally found to have a low TSH.  We checked a thyroid Uptake and Scan (02/17/2015) >> c/w Graves ds. Elevated 24 hour radio iodine uptake of 54% with homogeneous activity consistent with diffuse toxic goiter (Graves disease).  Graves Ab's were also elevated >> slightly decreasing: Lab Results  Component Value Date   TSI 320 (H) 01/31/2017   TSI 363 (H) 07/02/2015   TSI 391 (H) 12/01/2014   I reviewed pt's thyroid tests: Lab Results  Component Value Date   TSH 0.26 (L) 03/15/2017   TSH 0.02 (L) 01/31/2017   TSH 0.70 10/19/2016   TSH 2.99 08/24/2016   TSH 5.76 (H) 07/05/2016   TSH 0.08 (L) 04/08/2016   TSH 0.03 (L) 03/01/2016   TSH 0.15 (L) 12/15/2015   TSH 0.05 (L) 11/02/2015   TSH 0.04 (L) 07/02/2015   FREET4 0.82 03/15/2017   FREET4 1.22 01/31/2017   FREET4 0.66 10/19/2016   FREET4 0.65 08/24/2016   FREET4 0.57 (L) 07/05/2016   FREET4 0.71 04/08/2016   FREET4 1.20 03/01/2016   FREET4 1.17 12/15/2015   FREET4 1.05 11/02/2015   FREET4 1.44 07/02/2015  11/03/2014: TSH <0.01; free T4 1.16 (0.61-1.12); total T3 163 (71-190) 10/23/2014: TSH <0.01  We started MMI, then increased dose >> 15 mg in am and 10 mg in pm >> decreased to 10 mg in am and 5 mg in pm in 08/2016 >> she wasactually just taking 10 mg at night as she forgot am dose at last visit.  >> now on 5 mg in am and 10 mg in pm.  We tried to stop atenolol 25 mg daily at last visit >> tachycardic today, but HR normal at the end of the appt.  She just started Selenium 100 >> 200 mcg daily in last month.  Pt denies: - feeling nodules in neck - hoarseness - dysphagia - choking - SOB with lying down  She saw  ophthalmologist for L eye swelling >> was told it is either 2/2 allergies or thyroid ds. (before her Graves dx.) She feels better from this pov.  ROS: Constitutional: + weight loss (intentional >> weight watchers >> lost 14 lbs), no fatigue, no subjective hyperthermia, no subjective hypothermia Eyes: no blurry vision, no xerophthalmia ENT: no sore throat, no nodules palpated in throat, no dysphagia, no odynophagia, no hoarseness Cardiovascular: no CP/no SOB/no palpitations/no leg swelling Respiratory: no cough/no SOB/no wheezing Gastrointestinal: no N/no V/no D/no C/no acid reflux Musculoskeletal: no muscle aches/no joint aches Skin: no rashes, no hair loss Neurological: no tremors/no numbness/no tingling/no dizziness  I reviewed pt's medications, allergies, PMH, social hx, family hx, and changes were documented in the history of present illness. Otherwise, unchanged from my initial visit note.  Past Medical History:  Diagnosis Date  . Arthritis    Bil hands  . Headache   . Heart palpitations    takes atenolol daily  . PONV (postoperative nausea and vomiting)   . Seasonal allergies    takes allegra daily, flonase as needed  . Thyroid disease    Hyperthyroidism, takes methimazole daily   Past Surgical History:  Procedure Laterality Date  . CHOLECYSTECTOMY N/A 02/10/2016  Procedure: LAPAROSCOPIC CHOLECYSTECTOMY WITH INTRAOPERATIVE CHOLANGIOGRAM;  Surgeon: Chevis Pretty III, MD;  Location: [facility] OR;  Service: General;  Laterality: N/A;  . DILATION AND EVACUATION    . WISDOM TOOTH EXTRACTION     History   Social History  . 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 Smoking status: Never Smoker   . Smokeless tobacco: No  . Alcohol Use: No  . Drug Use: No   Current Outpatient Prescriptions on File Prior to Visit  Medication Sig Dispense Refill  . fexofenadine (ALLEGRA) 180 MG tablet Take  180 mg by mouth daily.    . fluticasone (FLONASE) 50 MCG/ACT nasal spray Place 2 sprays into both nostrils daily.    . methimazole (TAPAZOLE) 5 MG tablet Take 10 mg in the AM and 5 in the PM 360 tablet 0  . naproxen sodium (ANAPROX) 220 MG tablet Take 440 mg by mouth daily as needed (for pain).    Doyce Para 7/7/7 0.5/0.75/1-35 MG-MCG tablet Take 1 tablet by mouth daily.      No current facility-administered medications on file prior to visit.    No Known Allergies   Family History  Problem Relation Age of Onset  . Hypertension Brother   . Diabetes Maternal Grandmother   . Heart disease Maternal Grandfather 56       Heart attack  . Heart disease Paternal Grandfather 56       Heart Attack   PE: BP 110/62 (BP Location: Left Arm, Patient Position: Sitting)   Pulse 97   Wt 167 lb (75.8 kg)   SpO2 96%   BMI 29.12 kg/m  Body mass index is 29.12 kg/m. At the end of the appt >> pulse in the 80s Wt Readings from Last 3 Encounters:  07/31/17 167 lb (75.8 kg)  01/31/17 181 lb (82.1 kg)  07/05/16 168 lb (76.2 kg)   Constitutional: overweight, in NAD Eyes: PERRLA, EOMI, no exophthalmos, no lid lag, no stare; L upper eyelid with mild ptosis ENT: moist mucous membranes, no thyromegaly, no cervical lymphadenopathy+ Cardiovascular: RRR, No MRG Respiratory: CTA B Gastrointestinal: abdomen soft, NT, ND, BS+ Musculoskeletal: no deformities, strength intact in all 4 Skin: moist, warm, no rashes Neurological: no tremor with outstretched hands, DTR normal in all 4  ASSESSMENT: 1. Graves ds  2. 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 Patient with Graves ds, on MMi 5 mg in am and 10 mg in pm. At last visit, she was not completely compliant with the doses, but at this visit, she tells me she did not miss any doses except this am - she denies: denies fatigue, palpitations, tremors.  - we discussed to continue MMi for now, but if TFTs not improving, may need RAI tx - we need  to be careful with this as this may exacerbate Graves ophthalmopathy) - will check today: Orders Placed This Encounter  Procedures  . TSH  . T4, free  . T3, free  - she had tachycardia when she arrived (rushed) but HR decreased to normal at the end of the appt. She does not have palpitations >> advised her to check HR at home and let me know if >90 at rest >> will need to add back Atenolol in that case - RTC in 6 mo  2. L eyelid ptosis - improving - she previously saw ophthalmology  Re: L eye swelling and ptosis >> this is considered to be either from allergies or from her Graves ds. He did not deem a CT head necessary for now. It is conceivable that her eye ds may be from Graves ds as her TSI's are elevated still. Will recheck them at next visit.  Component     Latest Ref Rng & Units 07/31/2017  TSH     0.35 - 4.50 uIU/mL 4.96 (H)  Triiodothyronine,Free,Serum     2.3 - 4.2 pg/mL 3.0  T4,Free(Direct)     0.60 - 1.60 ng/dL 1.61   TSH slightly high >> will decrease the MMI to 5 mg 2x a day and recheck TFTs in 1.5 mo.  Carlus Pavlov, MD PhD [facility] Endocrin[facility]

## 2017-08-02 ENCOUNTER — Encounter: Payer: Self-pay | Admitting: Internal Medicine

## 2017-08-02 MED ORDER — METHIMAZOLE 5 MG PO TABS: ORAL_TABLET | ORAL | 0 refills | Status: DC

## 2017-11-09 ENCOUNTER — Other Ambulatory Visit: Payer: Self-pay | Admitting: Internal Medicine

## 2017-11-09 ENCOUNTER — Other Ambulatory Visit (INDEPENDENT_AMBULATORY_CARE_PROVIDER_SITE_OTHER): Payer: Managed Care, Other (non HMO)

## 2017-11-09 DIAGNOSIS — E0500 Thyrotoxicosis with diffuse goiter without thyrotoxic crisis or storm: Secondary | ICD-10-CM | POA: Diagnosis not present

## 2017-11-09 LAB — T3, FREE: T3, Free: 3.2 pg/mL (ref 2.3–4.2)

## 2017-11-09 LAB — TSH: TSH: 5 u[IU]/mL — ABNORMAL HIGH (ref 0.35–4.50)

## 2017-11-09 LAB — T4, FREE: Free T4: 0.78 ng/dL (ref 0.60–1.60)

## 2017-11-09 MED ORDER — METHIMAZOLE 5 MG PO TABS: ORAL_TABLET | ORAL | 1 refills | Status: DC

## 2018-01-31 ENCOUNTER — Ambulatory Visit: Payer: Managed Care, Other (non HMO) | Admitting: Internal Medicine

## 2018-03-15 ENCOUNTER — Encounter: Payer: Self-pay | Admitting: Internal Medicine

## 2018-03-15 ENCOUNTER — Ambulatory Visit (INDEPENDENT_AMBULATORY_CARE_PROVIDER_SITE_OTHER): Payer: Managed Care, Other (non HMO) | Admitting: Internal Medicine

## 2018-03-15 VITALS — BP 116/76 | HR 87 | Ht 63.5 in | Wt 168.2 lb

## 2018-03-15 DIAGNOSIS — E0500 Thyrotoxicosis with diffuse goiter without thyrotoxic crisis or storm: Secondary | ICD-10-CM | POA: Diagnosis not present

## 2018-03-15 DIAGNOSIS — H02402 Unspecified ptosis of left eyelid: Secondary | ICD-10-CM | POA: Diagnosis not present

## 2018-03-15 LAB — T3, FREE: T3 FREE: 3.2 pg/mL (ref 2.3–4.2)

## 2018-03-15 LAB — TSH: TSH: 1.7 u[IU]/mL (ref 0.35–4.50)

## 2018-03-15 LAB — T4, FREE: FREE T4: 0.72 ng/dL (ref 0.60–1.60)

## 2018-03-15 NOTE — Patient Instructions (Signed)
Please continue Methimazole 5 mg daily.  Continue Selenium 200 mcg daily.  Please stop at the lab.  Please come back for a follow-up appointment in 1 year.

## 2018-03-15 NOTE — Progress Notes (Signed)
Patient ID: Rose Alvarez, female   DOB: [birth year], 47 y.o.   MRN: [id number]  HPI  Rose Alvarez is a 47 y.o.-year-old female, initially referred by her PCP, Dr. Azucena Cecil, now returning for f/u for Graves ds. Last visit 8 months ago.  Reviewed and addended history:  She had a physical exam 10/23/2014 >> incidentally found to have a low TSH.  We checked a thyroid Uptake and Scan (02/17/2015) >> c/w Graves ds. Elevated 24 hour radio iodine uptake of 54% with homogeneous activity consistent with diffuse toxic goiter (Graves disease).  Her Graves' antibodies were elevated, but improving: Lab Results  Component Value Date   TSI 320 (H) 01/31/2017   TSI 363 (H) 07/02/2015   TSI 391 (H) 12/01/2014   I reviewed patient's TFTs: Lab Results  Component Value Date   TSH 5.00 (H) 11/09/2017   TSH 4.96 (H) 07/31/2017   TSH 0.26 (L) 03/15/2017   TSH 0.02 (L) 01/31/2017   TSH 0.70 10/19/2016   TSH 2.99 08/24/2016   TSH 5.76 (H) 07/05/2016   TSH 0.08 (L) 04/08/2016   TSH 0.03 (L) 03/01/2016   TSH 0.15 (L) 12/15/2015   FREET4 0.78 11/09/2017   FREET4 0.72 07/31/2017   FREET4 0.82 03/15/2017   FREET4 1.22 01/31/2017   FREET4 0.66 10/19/2016   FREET4 0.65 08/24/2016   FREET4 0.57 (L) 07/05/2016   FREET4 0.71 04/08/2016   FREET4 1.20 03/01/2016   FREET4 1.17 12/15/2015  11/03/2014: TSH <0.01; free T4 1.16 (0.61-1.12); total T3 163 (71-190) 10/23/2014: TSH <0.01  We started MMI, then increased dose >> 15 mg in am and 10 mg in pm >> decreased to 10 mg in am and 5 mg in pm in 08/2016 >> she wasactually just taking 10 mg at night as she forgot am dose at last visit.  >>  At last visit, she was on 5 mg in am and 10 mg in pm.  As her TSH returned elevated, we decreased the dose to 5 mg twice a day.  In 11/2017, we were able to decrease the dose further to 5 mg daily.  She continues  this dose today, without side effects.  We stopped atenolol 25 mg daily before last visit.  At last visit,  she was tachycardic, but this normalized towards the end of the appointment (she rushed to the appointment).  She continues on selenium 200 mcg daily.  Pt denies: - feeling nodules in neck - hoarseness - dysphagia - choking - SOB with lying down  She saw ophthalmologist for L eye swelling >> was told it is either 2/2 allergies or thyroid ds.  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  ROS: Constitutional: no weight gain/no weight loss, no fatigue, no subjective hyperthermia, no subjective hypothermia Eyes: no blurry vision, no xerophthalmia ENT: no sore throat, + see HPI Cardiovascular: no CP/no SOB/no palpitations/no leg swelling Respiratory: no cough/no SOB/no wheezing Gastrointestinal: no N/no V/no D/no C/no acid reflux Musculoskeletal: no muscle aches/no joint aches Skin: no rashes, no hair loss Neurological: no tremors/no numbness/no tingling/no dizziness  I reviewed pt's medications, allergies, PMH, social hx, family hx, and changes were documented in the history of present illness. Otherwise, unchanged from my initial visit note.  Past Medical History:  Diagnosis Date  . Arthritis    Bil hands  . Headache   . Heart palpitations    takes atenolol daily  . PONV (postoperative nausea and vomiting)   .  Seasonal allergies    takes allegra daily, flonase as needed  . Thyroid disease    Hyperthyroidism, takes methimazole daily   Past Surgical History:  Procedure Laterality Date  . CHOLECYSTECTOMY N/A 02/10/2016   Procedure: LAPAROSCOPIC CHOLECYSTECTOMY WITH INTRAOPERATIVE CHOLANGIOGRAM;  Surgeon: Chevis Pretty III, MD;  Location: [facility] OR;  Service: General;  Laterality: N/A;  . DILATION AND EVACUATION    . WISDOM TOOTH EXTRACTION     History   Social History  . 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 Smoking status: Never  Smoker   . Smokeless tobacco: No  . Alcohol Use: No  . Drug Use: No   Current Outpatient Medications on File Prior to Visit  Medication Sig Dispense Refill  . fexofenadine (ALLEGRA) 180 MG tablet Take 180 mg by mouth daily.    . fluticasone (FLONASE) 50 MCG/ACT nasal spray Place 2 sprays into both nostrils daily.    . methimazole (TAPAZOLE) 5 MG tablet Take 5 mg daily 90 tablet 1  . naproxen sodium (ANAPROX) 220 MG tablet Take 440 mg by mouth daily as needed (for pain).    Doyce Para 7/7/7 0.5/0.75/1-35 MG-MCG tablet Take 1 tablet by mouth daily.      No current facility-administered medications on file prior to visit.    No Known Allergies   Family History  Problem Relation Age of Onset  . Hypertension Brother   . Diabetes Maternal Grandmother   . Heart disease Maternal Grandfather 56       Heart attack  . Heart disease Paternal Grandfather 69       Heart Attack   PE: BP 116/76   Pulse 87   Ht 5' 3.5" (1.613 m)   Wt 168 lb 3.2 oz (76.3 kg)   SpO2 97%   BMI 29.33 kg/m  Body mass index is 29.33 kg/m.  Wt Readings from Last 3 Encounters:  03/15/18 168 lb 3.2 oz (76.3 kg)  07/31/17 167 lb (75.8 kg)  01/31/17 181 lb (82.1 kg)   Constitutional: overweight, in NAD Eyes: PERRLA, EOMI, no exophthalmos, no lid lag, no stare, + L upper eyelid with mild ptosis and suborbital mild edema ENT: moist mucous membranes, no thyromegaly, no cervical lymphadenopathy Cardiovascular: RRR, No MRG Respiratory: CTA B Gastrointestinal: abdomen soft, NT, ND, BS+ Musculoskeletal: no deformities, strength intact in all 4 Skin: moist, warm, no rashes Neurological: no tremor with outstretched hands, DTR normal in all 4  ASSESSMENT: 1. Graves ds  2. 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 Patient with Graves' disease, on methimazole, tolerating this well.  At last visit, we decrease the methimazole to 5 mg twice a day as her TSH was increased.  In 11/2017, we decreased the  dose further to 5 mg daily.  She is not missing doses. She denies fatigue, palpitations, tremors. -We again discussed to continue methimazole for now, especially since she requires less and less medication, but if TFTs started to become abnormal again, may need radioactive iodine treatment, but we need to be careful with this as it may exacerbate Graves' ophthalmopathy - will check today: Orders Placed This Encounter  Procedures  . T4, free  . T3, free  . TSH  . Thyroid stimulating immunoglobulin  -At last visit, she had tachycardia when she arrived for the visit, but she denied palpitations and her heart  rate decreased to normal at the end of the appointment.  I advised her to let me know if her pulse at rest is higher than 90, to start atenolol.  At this visit, she is not tachycardic. No beta blocker needed. - RTC in 1 year but plan for labs in ~6 mo  2. L eyelid ptosis -stable -She previously saw ophthalmology regarding her left eye swelling and ptosis and this is considered to be either from allergies or from her Graves' diseasen.  He did not deem a CT of the head necessary for that time.  It is conceivable that her eye disease may be from Graves' disease as her TSI's are still elevated, although ophthalmologist leaned more towrads allergies -We will recheck her TSI's today -Continue selenium which should help decreasing TSI's.  Component     Latest Ref Rng & Units 03/15/2018  TSH     0.35 - 4.50 uIU/mL 1.70  Triiodothyronine,Free,Serum     2.3 - 4.2 pg/mL 3.2  T4,Free(Direct)     0.60 - 1.60 ng/dL 1.610.72  TFTs are normal.  We will continue the current dose of 5 mg daily and plan to recheck the tests in 3 months. TSI's are pending.  Carlus Pavlovristina Mansa Willers, MD PhD [facility]

## 2018-03-19 LAB — THYROID STIMULATING IMMUNOGLOBULIN: TSI: 239 %{baseline} — AB (ref <140)

## 2018-08-04 ENCOUNTER — Other Ambulatory Visit: Payer: Self-pay | Admitting: Internal Medicine

## 2019-03-18 ENCOUNTER — Encounter: Payer: Self-pay | Admitting: Internal Medicine

## 2019-03-18 ENCOUNTER — Ambulatory Visit (INDEPENDENT_AMBULATORY_CARE_PROVIDER_SITE_OTHER): Payer: Managed Care, Other (non HMO) | Admitting: Internal Medicine

## 2019-03-18 ENCOUNTER — Other Ambulatory Visit: Payer: Self-pay

## 2019-03-18 DIAGNOSIS — H02402 Unspecified ptosis of left eyelid: Secondary | ICD-10-CM | POA: Diagnosis not present

## 2019-03-18 DIAGNOSIS — E0500 Thyrotoxicosis with diffuse goiter without thyrotoxic crisis or storm: Secondary | ICD-10-CM | POA: Diagnosis not present

## 2019-03-18 MED ORDER — METHIMAZOLE 5 MG PO TABS: 5.0000 mg | ORAL_TABLET | Freq: Every day | ORAL | 3 refills | Status: DC

## 2019-03-18 NOTE — Progress Notes (Signed)
Patient ID: Rose Alvarez, female   DOB: 1971/06/09, 48 y.o.   MRN: [id number]  Patient location: Home My location: Office  Referring Provider: Tally JoeSwayne, David, MD  I connected with the patient on 03/18/19 at 10:18 AM EDT by a video enabled telemedicine application and verified that I am speaking with the correct person.   I discussed the limitations of evaluation and management by telemedicine and the availability of in person appointments. The patient expressed understanding and agreed to proceed.   Details of the encounter are shown below.  HPI  Rose Alvarez is a 48 y.o.-year-old female, initially referred by her PCP, Dr. Azucena CecilSwayne, presenting for f/u for Graves ds. Last visit 1 year ago.  Reviewed history:  She had a physical exam 10/23/2014 >> incidentally found to have a low TSH.  We checked a thyroid Uptake and Scan (02/17/2015) >> c/w Graves ds. Elevated 24 hour radio iodine uptake of 54% with homogeneous activity consistent with diffuse toxic goiter (Graves disease).  Her Graves' antibodies were elevated but improving: Lab Results  Component Value Date   TSI 239 (H) 03/15/2018   TSI 320 (H) 01/31/2017   TSI 363 (H) 07/02/2015   TSI 391 (H) 12/01/2014   I reviewed patient's TFTs-last TSH was perfect: Lab Results  Component Value Date   TSH 1.70 03/15/2018   TSH 5.00 (H) 11/09/2017   TSH 4.96 (H) 07/31/2017   TSH 0.26 (L) 03/15/2017   TSH 0.02 (L) 01/31/2017   TSH 0.70 10/19/2016   TSH 2.99 08/24/2016   TSH 5.76 (H) 07/05/2016   TSH 0.08 (L) 04/08/2016   TSH 0.03 (L) 03/01/2016   FREET4 0.72 03/15/2018   FREET4 0.78 11/09/2017   FREET4 0.72 07/31/2017   FREET4 0.82 03/15/2017   FREET4 1.22 01/31/2017   FREET4 0.66 10/19/2016   FREET4 0.65 08/24/2016   FREET4 0.57 (L) 07/05/2016   FREET4 0.71 04/08/2016   FREET4 1.20 03/01/2016  11/03/2014: TSH <0.01; free T4 1.16 (0.61-1.12); total T3 163 (71-190) 10/23/2014: TSH <0.01  We started MMI, then increased  dose >> 15 mg in am and 10 mg in pm >> decreased to 10 mg in am and 5 mg in pm in 08/2016 >> she wasactually just taking 10 mg at night as she forgot am dose at last visit.  >>  At last visit, she was on 5 mg in am and 10 mg in pm.  As her TSH returned elevated, we decreased the dose to 5 mg twice a day.  In 11/2017, we were able to decrease the dose further to 5 mg daily.  She continues this dose today, and has no side effects.  We stopped atenolol 25 mg daily and she did not have return of her tachycardia.   She continues on selenium 200 mcg daily.  Pt denies: - feeling nodules in neck - hoarseness - dysphagia - choking - SOB with lying down  She saw ophthalmologist for L eye swelling >> was told it is either 2/2 allergies or thyroid ds.  Open therapy for her Luiz BlareGraves' disease diagnosis) She continues to feel that this is improving.  ROS: Constitutional: no weight gain/no weight loss, no fatigue, + hot flushes, no subjective hypothermia Eyes: no blurry vision, no xerophthalmia ENT: no sore throat, + see HPI Cardiovascular: no CP/no SOB/no palpitations/no leg swelling Respiratory: no cough/no SOB/no wheezing Gastrointestinal: no N/no V/no D/no C/no acid reflux Musculoskeletal: no muscle aches/no joint aches Skin: no rashes, no hair loss Neurological: no tremors/no numbness/no  tingling/no dizziness  I reviewed pt's medications, allergies, PMH, social hx, family hx, and changes were documented in the history of present illness. Otherwise, unchanged from my initial visit note.  Past Medical History:  Diagnosis Date  . Arthritis    Bil hands  . Headache   . Heart palpitations    takes atenolol daily  . PONV (postoperative nausea and vomiting)   . Seasonal allergies    takes allegra daily, flonase as needed  . Thyroid disease    Hyperthyroidism, takes methimazole daily   Past Surgical History:  Procedure Laterality Date  . CHOLECYSTECTOMY N/A 02/10/2016   Procedure: LAPAROSCOPIC  CHOLECYSTECTOMY WITH INTRAOPERATIVE CHOLANGIOGRAM;  Surgeon: Chevis Pretty III, MD;  Location: [facility] OR;  Service: General;  Laterality: N/A;  . DILATION AND EVACUATION    . WISDOM TOOTH EXTRACTION     History   Social History  . 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 Smoking status: Never Smoker   . Smokeless tobacco: No  . Alcohol Use: No  . Drug Use: No   Current Outpatient Medications on File Prior to Visit  Medication Sig Dispense Refill  . fexofenadine (ALLEGRA) 180 MG tablet Take 180 mg by mouth daily.    . fluticasone (FLONASE) 50 MCG/ACT nasal spray Place 2 sprays into both nostrils daily.    . methimazole (TAPAZOLE) 5 MG tablet TAKE 3 TABLETS BY MOUTH EVERY MORNING AND TAKE 2 TABLETS EVERY EVENING (Patient taking differently: 5 mg daily. Take 1 tablet daily) 200 tablet 2  . naproxen sodium (ANAPROX) 220 MG tablet Take 440 mg by mouth daily as needed (for pain).    Doyce Para 7/7/7 0.5/0.75/1-35 MG-MCG tablet Take 1 tablet by mouth daily.      No current facility-administered medications on file prior to visit.    No Known Allergies   Family History  Problem Relation Age of Onset  . Hypertension Brother   . Diabetes Maternal Grandmother   . Heart disease Maternal Grandfather 56       Heart attack  . Heart disease Paternal Grandfather 44       Heart Attack   PE: There were no vitals taken for this visit. There is no height or weight on file to calculate BMI.  Wt Readings from Last 3 Encounters:  03/15/18 168 lb 3.2 oz (76.3 kg)  07/31/17 167 lb (75.8 kg)  01/31/17 181 lb (82.1 kg)   Constitutional:  in NAD, L eye lower eyelid still a little swollen, but no lid lag or stare  The physical exam was not performed (virtual visit)  ASSESSMENT: 1. Graves ds  2. 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 Patient with history of Graves' disease, on  methimazole, tolerating this well.  We were able to decrease the methimazole dose slowly to 5 mg daily in 11/2017.  She is not missing any doses.  She denies fatigue, palpitations, tremors, heat intolerance. -We discussed that if her TFTs started to become abnormal again, she may need radioactive iodine treatment but we have to be careful since this may exacerbate her Graves' ophthalmopathy -We will recheck her TFTs when safe to come to the clinic: TSH, free T4, free T3 -She was previously on atenolol, but this was no longer needed at the last visit - RTC in 1 year but with labs sooner  2.  L eyelid ptosis -stable -She previously saw ophthalmology regarding her left eye swelling and ptosis and this is considered to be either from allergies (ophthalmologist leaned more towards this culprit) or from her Graves' disease.  No CT of the head was necessary at that time.  Of note, her TSI's were still elevated.  -We are continuing selenium which should help decreasing TSI's  Orders Placed This Encounter  Procedures  . TSH  . T4, free  . T3, free  . Thyroid stimulating immunoglobulin   - time spent with the patient: 15 min, of which >50% was spent in obtaining information about her symptoms, reviewing her previous labs, evaluations, and treatments, counseling her about her condition (please see the discussed topics above), and developing a plan to further investigate and treat it; she had a number of questions which I addressed.  Carlus Pavlov, MD PhD [facility] Endocrinology

## 2019-03-18 NOTE — Patient Instructions (Signed)
Please continue Methimazole 5 mg daily.  Continue Selenium 200 mcg daily.  Please come back for labs when safe.  Please come back for a follow-up appointment in 1 year.

## 2019-05-21 ENCOUNTER — Other Ambulatory Visit (INDEPENDENT_AMBULATORY_CARE_PROVIDER_SITE_OTHER): Payer: Managed Care, Other (non HMO)

## 2019-05-21 ENCOUNTER — Other Ambulatory Visit: Payer: Self-pay

## 2019-05-21 DIAGNOSIS — E0500 Thyrotoxicosis with diffuse goiter without thyrotoxic crisis or storm: Secondary | ICD-10-CM | POA: Diagnosis not present

## 2019-05-21 LAB — TSH: TSH: <0.01 u[IU]/mL — ABNORMAL LOW (ref 0.35–4.50)

## 2019-05-21 LAB — T4, FREE: Free T4: 2.42 ng/dL — ABNORMAL HIGH (ref 0.60–1.60)

## 2019-05-21 LAB — T3, FREE: T3, Free: 6.6 pg/mL — ABNORMAL HIGH (ref 2.3–4.2)

## 2019-05-22 ENCOUNTER — Other Ambulatory Visit: Payer: Self-pay | Admitting: Internal Medicine

## 2019-05-22 DIAGNOSIS — E0500 Thyrotoxicosis with diffuse goiter without thyrotoxic crisis or storm: Secondary | ICD-10-CM

## 2019-05-22 MED ORDER — METHIMAZOLE 5 MG PO TABS: 5.0000 mg | ORAL_TABLET | Freq: Two times a day (BID) | ORAL | 3 refills | Status: DC

## 2019-05-26 LAB — THYROID STIMULATING IMMUNOGLOBULIN: TSI: 300 % baseline — ABNORMAL HIGH (ref <140)

## 2019-07-11 ENCOUNTER — Other Ambulatory Visit: Payer: Self-pay

## 2019-07-11 ENCOUNTER — Other Ambulatory Visit (INDEPENDENT_AMBULATORY_CARE_PROVIDER_SITE_OTHER): Payer: 59

## 2019-07-11 DIAGNOSIS — E0500 Thyrotoxicosis with diffuse goiter without thyrotoxic crisis or storm: Secondary | ICD-10-CM | POA: Diagnosis not present

## 2019-07-11 LAB — TSH: TSH: <0.01 u[IU]/mL — ABNORMAL LOW (ref 0.35–4.50)

## 2019-07-11 LAB — T4, FREE: Free T4: 2.15 ng/dL — ABNORMAL HIGH (ref 0.60–1.60)

## 2019-07-11 LAB — T3, FREE: T3, Free: 4.8 pg/mL — ABNORMAL HIGH (ref 2.3–4.2)

## 2019-07-12 ENCOUNTER — Other Ambulatory Visit: Payer: Self-pay | Admitting: Internal Medicine

## 2019-07-12 DIAGNOSIS — E0500 Thyrotoxicosis with diffuse goiter without thyrotoxic crisis or storm: Secondary | ICD-10-CM

## 2019-07-12 MED ORDER — METHIMAZOLE 5 MG PO TABS: ORAL_TABLET | ORAL | 3 refills | Status: DC

## 2019-08-23 ENCOUNTER — Other Ambulatory Visit: Payer: Self-pay | Admitting: Obstetrics and Gynecology

## 2019-08-23 DIAGNOSIS — R928 Other abnormal and inconclusive findings on diagnostic imaging of breast: Secondary | ICD-10-CM

## 2019-08-28 ENCOUNTER — Other Ambulatory Visit: Payer: Self-pay | Admitting: Obstetrics and Gynecology

## 2019-08-28 ENCOUNTER — Other Ambulatory Visit: Payer: Self-pay

## 2019-08-28 ENCOUNTER — Ambulatory Visit
Admission: RE | Admit: 2019-08-28 | Discharge: 2019-08-28 | Disposition: A | Discharge status: Home / Self Care | Payer: 59 | Source: Ambulatory Visit | Attending: Obstetrics and Gynecology | Admitting: Obstetrics and Gynecology

## 2019-08-28 DIAGNOSIS — R928 Other abnormal and inconclusive findings on diagnostic imaging of breast: Secondary | ICD-10-CM

## 2019-08-28 DIAGNOSIS — R921 Mammographic calcification found on diagnostic imaging of breast: Secondary | ICD-10-CM

## 2019-08-29 ENCOUNTER — Other Ambulatory Visit: Payer: Self-pay | Admitting: Internal Medicine

## 2019-09-04 ENCOUNTER — Ambulatory Visit
Admission: RE | Admit: 2019-09-04 | Discharge: 2019-09-04 | Disposition: A | Discharge status: Home / Self Care | Payer: 59 | Source: Ambulatory Visit | Attending: Obstetrics and Gynecology | Admitting: Obstetrics and Gynecology

## 2019-09-04 ENCOUNTER — Other Ambulatory Visit: Payer: Self-pay

## 2019-09-04 ENCOUNTER — Other Ambulatory Visit: Payer: Self-pay | Admitting: Diagnostic Radiology

## 2019-09-04 ENCOUNTER — Other Ambulatory Visit: Payer: Self-pay | Admitting: Obstetrics and Gynecology

## 2019-09-04 DIAGNOSIS — R921 Mammographic calcification found on diagnostic imaging of breast: Secondary | ICD-10-CM

## 2019-09-16 ENCOUNTER — Telehealth: Payer: Self-pay | Admitting: Hematology and Oncology

## 2019-09-16 NOTE — Telephone Encounter (Signed)
Spoke with patient re // appointment with Dr. Lindi Adie. Patient given date/time/location/phone. Demographic/insurance info confirmed.

## 2019-09-17 ENCOUNTER — Other Ambulatory Visit: Payer: Self-pay

## 2019-09-17 ENCOUNTER — Encounter: Payer: Self-pay | Admitting: *Deleted

## 2019-09-17 ENCOUNTER — Inpatient Hospital Stay: Payer: 59 | Attending: Hematology and Oncology | Admitting: Hematology and Oncology

## 2019-09-17 DIAGNOSIS — D0511 Intraductal carcinoma in situ of right breast: Secondary | ICD-10-CM | POA: Diagnosis not present

## 2019-09-17 DIAGNOSIS — Z170 Estrogen receptor positive status [ER+]: Secondary | ICD-10-CM | POA: Diagnosis not present

## 2019-09-17 NOTE — Telephone Encounter (Signed)
Per navigator moved new patient appointment from 10/22 to today. Confirmed with patient.

## 2019-09-17 NOTE — Research (Signed)
09/17/19 at 5:18pm - COMET trial- Dr. Lindi Adie felt this pt was a good candidate for this trial.  The research nurse met with the pt for 30 minutes going over all aspects of the trial.  The pt was given the consent form and the COMET pt brochure to take home and read along with the research nurse's business card.  The pt was told that her study participation is completely voluntary.  The research nurse reviewed the study treatment arms with the pt as well the required study assessments.  The pt said that she needs to discuss the study with her family.  The pt and the research nurse agreed to talk on Monday, 09/23/19, about her study participation.  The pt was thanked for her interest in the COMET trial. Brion Aliment RN, BSN, [place] Nurse 09/17/2019 5:20 PM

## 2019-09-17 NOTE — Assessment & Plan Note (Addendum)
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 It is considered a precancerous lesion. DCIS is classified as a 0. It is generally detected through mammograms as calcifications. We discussed the significance of grades and its impact on prognosis. We also discussed the importance of ER and PR receptors and their implications to adjuvant treatment options. Prognosis of DCIS dependence on grade, comedo necrosis. It is anticipated that if not treated, 20-30% of DCIS can develop into invasive breast cancer.  Recommendation: 1.  Standard of care would be mastectomy 2.  Discussed the role of active surveillance in Comet clinical trial 3. Followed by antiestrogen therapy with tamoxifen 5 years  Tamoxifen counseling: We discussed the risks and benefits of tamoxifen. These include but not limited to insomnia, hot flashes, mood changes, vaginal dryness, and weight gain. Although rare, serious side effects including endometrial cancer, risk of blood clots were also discussed. We strongly believe that the benefits far outweigh the risks. Patient understands these risks and consented to starting treatment. Planned treatment duration is 5 years.  AFT 25 COMET Phase 3 clinical trial for low risk DCIS grade 1/2 PR positive, age greater than 40 randomized to surgery +/- radiation, +/- endocrine therapy versus active surveillance with +/- endocrine therapy surveillance with mammograms every 6 months for 5 years;patient's have option to decline elevated arm and still be followed on study   Patient will consider all of her options and will inform us of her decision.

## 2019-09-17 NOTE — Progress Notes (Signed)
[facility] CONSULT NOTE  Patient Care Team: Tally Joe, MD as PCP - General (Family Medicine)  CHIEF COMPLAINTS/PURPOSE OF CONSULTATION:  Newly diagnosed right breast DCIS  HISTORY OF PRESENTING ILLNESS:  Rose Alvarez 48 y.o. female is here because of recent diagnosis of right breast DCIS.  Patient had a routine screening mammogram detected calcifications in the right breast spanning 4.6 cm.  Biopsy revealed complex sclerosing lesion with low-grade DCIS.  She had 2 biopsies which were both similar-appearing.  She was seen by Dr. Carolynne Edouard who presented her options including mastectomy versus participating in Comet clinical trial.  She is here in office today to discuss these options further.  Her husband was on the telephone as well.  She denies any lumps or nodules in the breast.  I reviewed her records extensively and collaborated the history with the patient.  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 Arthritis    Bil hands  . Headache   . Heart palpitations    takes atenolol daily  . PONV (postoperative nausea and vomiting)   . Seasonal allergies    takes allegra daily, flonase as needed  . Thyroid disease    Hyperthyroidism, takes methimazole daily    SURGICAL HISTORY: Past Surgical History:  Procedure Laterality Date  . CHOLECYSTECTOMY N/A 02/10/2016   Procedure: LAPAROSCOPIC CHOLECYSTECTOMY WITH INTRAOPERATIVE CHOLANGIOGRAM;  Surgeon: Chevis Pretty III, MD;  Location: [facility] OR;  Service: General;  Laterality: N/A;  . DILATION AND EVACUATION    . WISDOM TOOTH EXTRACTION      SOCIAL HISTORY: Social History    Socioeconomic History  . Marital status: Married    Spouse name: Not on file  . Number of children: Not on file  . Years of education: Not on file  . Highest education level: Not on file  Occupational History  . Not on file  Social Needs  . Financial resource strain: Not on file  . Food insecurity    Worry: Not on file    Inability: Not on file  . Transportation needs    Medical: Not on file    Non-medical: Not on file  Tobacco Use  . Smoking status: Never Smoker  . Smokeless tobacco: Never Used  Substance and Sexual Activity  . Alcohol use: No    Alcohol/week: 0.0 standard drinks  . Drug use: No  . Sexual activity: Yes  Lifestyle  . Physical activity    Days per week: Not on file    Minutes per session: Not on file  . Stress: Not on file  Relationships  . Social Musician on phone: Not on file    Gets together: Not on file    Attends religious service: Not on file    Active member of club or organization: Not on file    Attends meetings of clubs or organizations: Not on file    Relationship status: Not on file  . Intimate partner violence    Fear of current or ex partner: Not on file    Emotionally abused: Not on file    Physically abused: Not on file    Forced  sexual activity: Not on file  Other Topics Concern  . Not on file  Social History Narrative  . Not on file    FAMILY HISTORY: Family History  Problem Relation Age of Onset  . Hypertension Brother   . Diabetes Maternal Grandmother   . Heart disease Maternal Grandfather 56       Heart attack  . Heart disease Paternal Grandfather [id number]       Heart Attack    ALLERGIES:  has No Known Allergies.  MEDICATIONS:  Current Outpatient Medications  Medication Sig Dispense Refill  . fexofenadine (ALLEGRA) 180 MG tablet Take 180 mg by mouth daily.    . fluticasone (FLONASE) 50 MCG/ACT nasal spray Place 2 sprays into both nostrils daily.    . methimazole (TAPAZOLE) 5 MG tablet TAKE 3 TABLETS BY  MOUTH EVERY MORNING AND TAKE 2 TABLETS EVERY EVENING 200 tablet 2  . naproxen sodium (ANAPROX) 220 MG tablet Take 440 mg by mouth daily as needed (for pain).    Doyce Para. NORTREL 7/7/7 0.5/0.75/1-35 MG-MCG tablet Take 1 tablet by mouth daily.      No current facility-administered medications for this visit.     REVIEW OF SYSTEMS:   Constitutional: Denies fevers, chills or abnormal night sweats Eyes: Denies blurriness of vision, double vision or watery eyes Ears, nose, mouth, throat, and face: Denies mucositis or sore throat Respiratory: Denies cough, dyspnea or wheezes Cardiovascular: Denies palpitation, chest discomfort or lower extremity swelling Gastrointestinal:  Denies nausea, heartburn or change in bowel habits Skin: Denies abnormal skin rashes Lymphatics: Denies new lymphadenopathy or easy bruising Neurological:Denies numbness, tingling or new weaknesses Behavioral/Psych: Mood is stable, no new changes  Breast:  Denies any palpable lumps or discharge All other systems were reviewed with the patient and are negative.  PHYSICAL EXAMINATION: ECOG PERFORMANCE STATUS: 0 - Asymptomatic  Vitals:   09/17/19 1620  BP: 128/72  Pulse: 98  Resp: 18  Temp: 98.3 F (36.8 C)  SpO2: 98%   Filed Weights   09/17/19 1620  Weight: 172 lb 3.2 oz (78.1 kg)    GENERAL:alert, no distress and comfortable SKIN: skin color, texture, turgor are normal, no rashes or significant lesions EYES: normal, conjunctiva are pink and non-injected, sclera clear OROPHARYNX:no exudate, no erythema and lips, buccal mucosa, and tongue normal  NECK: supple, thyroid normal size, non-tender, without nodularity LYMPH:  no palpable lymphadenopathy in the cervical, axillary or inguinal LUNGS: clear to auscultation and percussion with normal breathing effort HEART: regular rate & rhythm and no murmurs and no lower extremity edema ABDOMEN:abdomen soft, non-tender and normal bowel sounds Musculoskeletal:no cyanosis of  digits and no clubbing  PSYCH: alert & oriented x 3 with fluent speech NEURO: no focal motor/sensory deficits BREAST: No palpable nodules in breast. No palpable axillary or supraclavicular lymphadenopathy (exam performed in the presence of a chaperone)   LABORATORY DATA:  I have reviewed the data as listed Lab Results  Component Value Date   WBC 7.1 02/04/2016   HGB 12.0 02/04/2016   HCT 36.3 02/04/2016   MCV 87.3 02/04/2016   PLT 301 02/04/2016   Lab Results  Component Value Date   NA 140 02/04/2016   K 4.0 02/04/2016   CL 106 02/04/2016   CO2 24 02/04/2016    RADIOGRAPHIC STUDIES: I have personally reviewed the radiological reports and agreed with the findings in the report.  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 It is considered a precancerous lesion. DCIS is classified as a 0. It is generally detected through mammograms as calcifications. We discussed the significance of grades and its impact on prognosis. We also discussed the importance of ER and PR receptors and their implications to adjuvant treatment options. Prognosis of DCIS dependence on grade, comedo necrosis. It is anticipated that if not treated, 20-30% of DCIS can develop into invasive breast cancer.  Recommendation: 1.  Standard of care would be mastectomy 2.  Discussed the role of active surveillance in Comet clinical trial 3. Followed by antiestrogen therapy with tamoxifen 5 years  Tamoxifen counseling: We discussed the risks and benefits of tamoxifen. These include but not limited to insomnia, hot flashes, mood changes, vaginal dryness, and weight gain. Although rare,  serious side effects including endometrial cancer, risk of blood clots were also discussed. We strongly believe that the benefits far outweigh the risks. Patient understands these risks and consented to starting treatment. Planned treatment duration is 5 years.  AFT 25 COMET Phase 3 clinical trial for low risk DCIS grade 1/2 PR positive, age greater than 40 randomized to surgery +/- radiation, +/- endocrine therapy versus active surveillance with +/- endocrine therapy surveillance with mammograms every 6 months for 5 years;patient's have option to decline elevated arm and still be followed on study   Patient will consider all of her options and will inform us of her decision.     All questions were answered. The patient knows to call the clinic with any problems, questions or concerns.    Harriette Ohara, MD 09/17/19

## 2019-09-18 ENCOUNTER — Telehealth: Payer: Self-pay | Admitting: Internal Medicine

## 2019-09-18 ENCOUNTER — Other Ambulatory Visit: Payer: Self-pay | Admitting: General Surgery

## 2019-09-18 ENCOUNTER — Encounter: Payer: Self-pay | Admitting: *Deleted

## 2019-09-18 DIAGNOSIS — D0511 Intraductal carcinoma in situ of right breast: Secondary | ICD-10-CM

## 2019-09-18 NOTE — Telephone Encounter (Signed)
Patient requests Dr. Cruzita Lederer call patient at ph# [phone] re: a new diagnosis/medication patient would like to discuss.

## 2019-09-20 NOTE — Telephone Encounter (Signed)
Yes, we can schedule her.

## 2019-09-20 NOTE — Telephone Encounter (Signed)
Please advise if you would like her to make appointment.

## 2019-09-20 NOTE — Telephone Encounter (Signed)
Per Dr. Cruzita Lederer, please contact patient to schedule her an appointment.  Thank you.

## 2019-09-24 ENCOUNTER — Telehealth: Payer: Self-pay | Admitting: *Deleted

## 2019-09-24 NOTE — Telephone Encounter (Signed)
09/24/19 at 3:21pm - COMET study- The research nurse called the pt to discuss her participation.  The pt said that she has decided to not participate because of her upcoming move.  She said that she does not want "too much on her plate now".  She said that she may want to wait until she [place]oc[place] out of [place] before she has her surgery.  She was concerned that she may be randomized to the surgery arm.  The pt also said that she is considering a 2nd opinion.  She said that she is undecided if she wants to begin endocrine therapy to treat her DCIS.  The pt was thanked for her consideration of the study.  The research nurse informed Bary Castilla, breast navigator, about the pt's decision to not participate in the study.   Brion Aliment RN, BSN, CCRP Clinical Research Nurse 09/24/2019 3:27 PM

## 2019-09-26 ENCOUNTER — Ambulatory Visit
Admission: RE | Admit: 2019-09-26 | Discharge: 2019-09-26 | Disposition: A | Discharge status: Home / Self Care | Payer: 59 | Source: Ambulatory Visit | Attending: General Surgery | Admitting: General Surgery

## 2019-09-26 ENCOUNTER — Telehealth: Payer: Self-pay | Admitting: *Deleted

## 2019-09-26 ENCOUNTER — Ambulatory Visit: Payer: 59 | Admitting: Hematology and Oncology

## 2019-09-26 ENCOUNTER — Other Ambulatory Visit: Payer: Self-pay

## 2019-09-26 DIAGNOSIS — D0511 Intraductal carcinoma in situ of right breast: Secondary | ICD-10-CM

## 2019-09-26 MED ORDER — GADOBUTROL 1 MMOL/ML IV SOLN
8.0000 mL | Freq: Once | INTRAVENOUS | Status: AC | PRN
  Administered 2019-09-26: 8 mL via INTRAVENOUS

## 2019-09-26 NOTE — Telephone Encounter (Signed)
Spoke to pt and gave navigation resources and contact information. Pt had declined COMET trail. Is going for 2nd opinion at [facility] on 10/28. Discussed MRI prior to surgery. Pt wishes to cancel her appt with Dr. Marla Roe as she is thinking she will likely have sx in [place]. She may decide to start Tamoxifen and will call with her decision. Denies further questions or needs at this time. Physician team notified of pt plans.

## 2019-09-27 ENCOUNTER — Encounter: Payer: Self-pay | Admitting: Internal Medicine

## 2019-09-27 ENCOUNTER — Telehealth: Payer: Self-pay

## 2019-09-27 ENCOUNTER — Ambulatory Visit: Payer: 59 | Admitting: Internal Medicine

## 2019-09-27 VITALS — BP 128/78 | HR 85 | Ht 63.5 in | Wt 175.0 lb

## 2019-09-27 DIAGNOSIS — E0500 Thyrotoxicosis with diffuse goiter without thyrotoxic crisis or storm: Secondary | ICD-10-CM

## 2019-09-27 DIAGNOSIS — H02402 Unspecified ptosis of left eyelid: Secondary | ICD-10-CM

## 2019-09-27 LAB — T4, FREE: Free T4: 0.98 ng/dL (ref 0.60–1.60)

## 2019-09-27 LAB — TSH: TSH: 0.01 u[IU]/mL — ABNORMAL LOW (ref 0.35–4.50)

## 2019-09-27 LAB — T3, FREE: T3, Free: 3.2 pg/mL (ref 2.3–4.2)

## 2019-09-27 NOTE — Progress Notes (Signed)
Patient ID: Rose Alvarez, female   DOB: 01-26-1971, 48 y.o.   MRN: [id number]  HPI  Rose Alvarez is a 48 y.o.-year-old female, initially referred by her PCP, Dr. Azucena CecilSwayne, presenting for f/u for Graves ds. Last visit 6 months ago.  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  Reviewed history:  She had a physical exam 10/23/2014 >> incidentally found to have a low TSH.  We checked a thyroid Uptake and Scan (02/17/2015) >> c/w Graves ds. Elevated 24 hour radio iodine uptake of 54% with homogeneous activity consistent with diffuse toxic goiter (Graves disease).  Her Graves' antibodies are still elevated: Lab Results  Component Value Date   TSI 300 (H) 05/21/2019   TSI 239 (H) 03/15/2018   TSI 320 (H) 01/31/2017   TSI 363 (H) 07/02/2015   TSI 391 (H) 12/01/2014   Reviewed patient's TFTs: Lab Results  Component Value Date   TSH <0.01 (L) 07/11/2019   TSH <0.01 (L) 05/21/2019   TSH 1.70 03/15/2018   TSH 5.00 (H) 11/09/2017   TSH 4.96 (H) 07/31/2017   TSH 0.26 (L) 03/15/2017   TSH 0.02 (L) 01/31/2017   TSH 0.70 10/19/2016   TSH 2.99 08/24/2016   TSH 5.76 (H) 07/05/2016   FREET4 2.15 (H) 07/11/2019   FREET4 2.42 (H) 05/21/2019   FREET4 0.72 03/15/2018   FREET4 0.78 11/09/2017   FREET4 0.72 07/31/2017   FREET4 0.82 03/15/2017   FREET4 1.22 01/31/2017   FREET4 0.66 10/19/2016   FREET4 0.65 08/24/2016   FREET4 0.57 (L) 07/05/2016   Lab Results  Component Value Date   T3FREE 4.8 (H) 07/11/2019   T3FREE 6.6 (H) 05/21/2019   T3FREE 3.2 03/15/2018   T3FREE 3.2 11/09/2017   T3FREE 3.0 07/31/2017   T3FREE 3.8 03/15/2017   T3FREE 4.6 (H) 01/31/2017   T3FREE 2.8 10/19/2016   T3FREE 2.8 08/24/2016   T3FREE 3.1 07/05/2016   T3FREE 3.3 04/08/2016   T3FREE 3.9 03/01/2016   T3FREE 3.5 12/15/2015   T3FREE 3.1 11/02/2015   T3FREE 4.0 07/02/2015   11/03/2014: TSH <0.01; free T4 1.16 (0.61-1.12); total T3  163 (71-190) 10/23/2014: TSH <0.01  We started MMI, then increased dose >> 15 mg in am and 10 mg in pm >> decreased to 10 mg in am and 5 mg in pm in 08/2016 >> she wasactually just taking 10 mg at night as she forgot am dose at last visit.  >>  At last visit, she was on 5 mg in am and 10 mg in pm.  As her TSH returned elevated, we decreased the dose to 5 mg twice a day.  In 11/2017, we were able to decrease the dose to 5 mg daily.   However, more recently, in 05/2019, she developed again a suppressed TSH with high free thyroid hormones and will increase the methimazole dose to 5 mg twice a day.  In 07/2019, we increase this to 10 mg in a.m. and 5 mg in p.m., dose that she continues to date.  We stopped atenolol 25 mg daily - no recurrent tachycardia.  She continues on selenium 200 mcg daily.  Pt denies: - feeling nodules in neck - hoarseness - dysphagia - choking - SOB with lying down  She saw ophthalmologist for L eye swelling >> was told it is either 2/2 allergies or thyroid ds.  The ophthalmology just leans more towards an allergic etiology. She continues  to feel that this is improving.  ROS: Constitutional: no weight gain/no weight loss, no fatigue, no subjective hyperthermia, no subjective hypothermia Eyes: no blurry vision, no xerophthalmia ENT: no sore throat, + see HPI Cardiovascular: no CP/no SOB/no palpitations/no leg swelling Respiratory: no cough/no SOB/no wheezing Gastrointestinal: no N/no V/no D/no C/no acid reflux Musculoskeletal: no muscle aches/no joint aches Skin: no rashes, no hair loss Neurological: no tremors/no numbness/no tingling/no dizziness  I reviewed pt's medications, allergies, PMH, social hx, family hx, and changes were documented in the history of present illness. Otherwise, unchanged from my initial visit note.  Past Medical History:  Diagnosis Date  . Arthritis    Bil hands  . Headache   . Heart palpitations    takes atenolol daily  . PONV  (postoperative nausea and vomiting)   . Seasonal allergies    takes allegra daily, flonase as needed  . Thyroid disease    Hyperthyroidism, takes methimazole daily   Past Surgical History:  Procedure Laterality Date  . CHOLECYSTECTOMY N/A 02/10/2016   Procedure: LAPAROSCOPIC CHOLECYSTECTOMY WITH INTRAOPERATIVE CHOLANGIOGRAM;  Surgeon: Chevis Pretty III, MD;  Location: [facility] OR;  Service: General;  Laterality: N/A;  . DILATION AND EVACUATION    . WISDOM TOOTH EXTRACTION     History   Social History  . 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 Smoking status: Never Smoker   . Smokeless tobacco: No  . Alcohol Use: No  . Drug Use: No   Current Outpatient Medications on File Prior to Visit  Medication Sig Dispense Refill  . fexofenadine (ALLEGRA) 180 MG tablet Take 180 mg by mouth daily.    . fluticasone (FLONASE) 50 MCG/ACT nasal spray Place 2 sprays into both nostrils daily.    . methimazole (TAPAZOLE) 5 MG tablet TAKE 3 TABLETS BY MOUTH EVERY MORNING AND TAKE 2 TABLETS EVERY EVENING 200 tablet 2  . naproxen sodium (ANAPROX) 220 MG tablet Take 440 mg by mouth daily as needed (for pain).    Doyce Para 7/7/7 0.5/0.75/1-35 MG-MCG tablet Take 1 tablet by mouth daily.      No current facility-administered medications on file prior to visit.    No Known Allergies   Family History  Problem Relation Age of Onset  . Hypertension Brother   . Diabetes Maternal Grandmother   . Heart disease Maternal Grandfather 56       Heart attack  . Heart disease Paternal Grandfather 38       Heart Attack   PE: BP 128/78   Pulse 85   Ht 5' 3.5" (1.613 m)   Wt 175 lb (79.4 kg)   SpO2 98%   BMI 30.51 kg/m  Body mass index is 30.51 kg/m.  Wt Readings from Last 3 Encounters:  09/27/19 175 lb (79.4 kg)  09/17/19 172 lb 3.2 oz (78.1 kg)  03/15/18 168 lb 3.2 oz (76.3 kg)   Constitutional: overweight, in  NAD Eyes: PERRLA, EOMI, no exophthalmos, + left eyelid still a little swollen, but no lid lag or stare ENT: moist mucous membranes, no thyromegaly, no cervical lymphadenopathy Cardiovascular: RRR, No MRG Respiratory: CTA B Gastrointestinal: abdomen soft, NT, ND, BS+ Musculoskeletal: no deformities, strength intact in all 4 Skin: moist, warm, no rashes Neurological: no tremor with outstretched hands, DTR normal in all 4  ASSESSMENT: 1. Graves ds  2. 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 Patient with history of Graves' disease, methimazole, tolerating this well.  We decrease the methimazole dose slowly to 5 mg daily in 11/2017.  However, in 05/2019 we had to increase the dose due to thyrotoxic hormone levels and we increased it further to 10 mg in a.m. and 5 mg in p.m. in 07/2019 as the TFTs were still thyrotoxic then.  -She denies thyrotoxic symptoms no fatigue, palpitations, tremors, heat intolerance. -We discussed that if her TFTs started to become abnormal again, she may need RAI treatment but we have to be careful since this may exacerbate her eye disease (see below) -We will recheck her TFTs today: TSH, free T4, free T3 -Previously on atenolol, now off; no need to restart as her pulse is normal -she will move to [place] next mo  2. L eyelid ptosis -Stable, no chemosis, eye pain, diplopia -She saw ophthalmology regarding her left eye swelling and ptosis and this is considered to be either from allergies (most likely) or from her Graves' disease.  No CT of the head was necessary at that time. -We reviewed together her TSI antibody levels and these were still elevated at last check -We are continuing selenium 200 mcg daily which should help decreasing TSI's.  - time spent with the patient: 20 min, of which >50% was spent in obtaining information about her symptoms, reviewing her previous labs, evaluations, and treatments, counseling her about her condition (please see  the discussed topics above), and developing a plan to further investigate and treat it; she had a number of questions which I addressed.  Component     Latest Ref Rng & Units 09/27/2019  TSH     0.35 - 4.50 uIU/mL 0.01 (L)  Triiodothyronine,Free,Serum     2.3 - 4.2 pg/mL 3.2  T4,Free(Direct)     0.60 - 1.60 ng/dL 0.98   Her thyroid tests have improved significantly.  For now I would suggest to continue the current dose of methimazole and to repeat her thyroid tests in ~1 month.  She will be back in town for Thanksgiving so I will advise her to come by our office for labs.  Philemon Kingdom, MD PhD [facility] Endocrinology

## 2019-09-27 NOTE — Telephone Encounter (Signed)
Per Request last office note faxed to [place]

## 2019-09-27 NOTE — Patient Instructions (Signed)
Please stop at the lab.  For now, continue Methimazole 10 mg in am and 5 mg in pm.  Return to see me as needed.

## 2019-09-28 ENCOUNTER — Other Ambulatory Visit: Payer: 59

## 2019-10-01 ENCOUNTER — Institutional Professional Consult (permissible substitution): Payer: 59 | Admitting: Plastic Surgery

## 2019-10-03 ENCOUNTER — Inpatient Hospital Stay (HOSPITAL_BASED_OUTPATIENT_CLINIC_OR_DEPARTMENT_OTHER): Payer: 59 | Admitting: Hematology and Oncology

## 2019-10-03 DIAGNOSIS — D0511 Intraductal carcinoma in situ of right breast: Secondary | ICD-10-CM

## 2019-10-03 MED ORDER — TAMOXIFEN CITRATE 20 MG PO TABS: 20.0000 mg | ORAL_TABLET | Freq: Every day | ORAL | 3 refills | Status: DC

## 2019-10-03 NOTE — Progress Notes (Signed)
Telephone visit: I verified the patient's identity with 2 identifiers.   Patient Care Team: Tally Joe, MD as PCP - General (Family Medicine) Pershing Proud, RN as Oncology Nurse Navigator Donnelly Angelica, RN as Oncology Nurse Navigator  DIAGNOSIS:  Encounter Diagnosis  Name Primary?  . Ductal carcinoma in situ (DCIS) of right breast     SUMMARY OF ONCOLOGIC HISTORY: Oncology History  Ductal carcinoma in situ (DCIS) of right breast  08/28/2019 Initial Diagnosis   08/28/2019: Screening detected right breast calcifications spanning 4.6 cm right breast lower outer quadrant, biopsy revealed low-grade DCIS within a complex sclerosing lesion ER/PR positive, Tis NX stage 0     CHIEF COMPLIANT: Follow-up to discuss her treatment plan  INTERVAL HISTORY: Rose Alvarez is a 48 year old with above-mentioned history of right breast DCIS who was seen by still and we recommended mastectomy.  She underwent a breast MRI which showed a 7 cm area of DCIS involving majority of the breast.  She had a second opinion at [facility] who also recommended mastectomy.  She is moving to [place] at the end of November and would like to receive her at surgical care in [place].  She called [place] today to discuss bridging treatment options until she gets to [place].  REVIEW OF SYSTEMS:   Constitutional: Denies fevers, chills or abnormal weight loss Eyes: Denies blurriness of vision Ears, nose, mouth, throat, and face: Denies mucositis or sore throat Respiratory: Denies cough, dyspnea or wheezes Cardiovascular: Denies palpitation, chest discomfort Gastrointestinal:  Denies nausea, heartburn or change in bowel habits Skin: Denies abnormal skin rashes Lymphatics: Denies new lymphadenopathy or easy bruising Neurological:Denies numbness, tingling or new weaknesses Behavioral/Psych: Mood is stable, no new changes  Extremities: No lower extremity edema   All other systems were  reviewed with the patient and are negative.  I have reviewed the past medical history, past surgical history, social history and family history with the patient and they are unchanged from previous note.  ALLERGIES:  has No Known Allergies.  MEDICATIONS:  Current Outpatient Medications  Medication Sig Dispense Refill  . fexofenadine (ALLEGRA) 180 MG tablet Take 180 mg by mouth daily.    . fluticasone (FLONASE) 50 MCG/ACT nasal spray Place 2 sprays into both nostrils daily.    . methimazole (TAPAZOLE) 5 MG tablet TAKE 3 TABLETS BY MOUTH EVERY MORNING AND TAKE 2 TABLETS EVERY EVENING (Patient taking differently: 1 tablet in the AM and 2 PM.) 200 tablet 2  . naproxen sodium (ANAPROX) 220 MG tablet Take 440 mg by mouth daily as needed (for pain).    . tamoxifen (NOLVADEX) 20 MG tablet Take 1 tablet (20 mg total) by mouth daily. 90 tablet 3   No current facility-administered medications for this visit.     PHYSICAL EXAMINATION: ECOG PERFORMANCE STATUS: 1 - Symptomatic but completely ambulatory   LABORATORY DATA:  I have reviewed the data as listed CMP Latest Ref Rng & Units 02/04/2016 11/03/2014  Glucose 65 - 99 mg/dL 91 -  BUN 6 - 20 mg/dL 6 8  Creatinine 4.13 - 1.00 mg/dL 2.44 0.6  Sodium 010 - 145 mmol/L 140 138  Potassium 3.5 - 5.1 mmol/L 4.0 4.5  Chloride 101 - 111 mmol/L 106 -  CO2 22 - 32 mmol/L 24 -  Calcium 8.9 - 10.3 mg/dL 9.4 -  Total Protein 6.5 - 8.1 g/dL 6.4(L) -  Total Bilirubin 0.3 - 1.2 mg/dL 0.5 -  Alkaline Phos 38 - 126 U/L 50 -  AST 15 - 41 U/L 20 19  ALT 14 - 54 U/L 17 22    Lab Results  Component Value Date   WBC 7.1 02/04/2016   HGB 12.0 02/04/2016   HCT 36.3 02/04/2016   MCV 87.3 02/04/2016   PLT 301 02/04/2016    ASSESSMENT & PLAN:  Ductal carcinoma in situ (DCIS) of right breast 08/28/2019: Screening detected right breast calcifications spanning 4.6 cm right breast lower outer quadrant, biopsy revealed low-grade DCIS within a complex sclerosing  lesion ER/PR positive, Tis NX stage 0  Treatment plan: 1.  Because the patient is moving to [place] she would like to get the mastectomy performed in [place] and reconstruction as well. 2.  Because she is moving end of November, I recommended starting her on tamoxifen 20 mg daily.  We discussed the risks and benefits of tamoxifen. These include but not limited to insomnia, hot flashes, mood changes, vaginal dryness, and weight gain. Although rare, serious side effects including endometrial cancer, risk of blood clots were also discussed. We strongly believe that the benefits far outweigh the risks. Patient understands these risks and consented to starting treatment.  Patient was informed as to where she is going to in [place] so that we can forward her records to them.    No orders of the defined types were placed in this encounter.  The patient has a good understanding of the overall plan. she agrees with it. she will call with any problems that may develop before the next visit here.   Harriette Ohara, MD 10/03/19

## 2019-10-03 NOTE — Assessment & Plan Note (Signed)
08/28/2019: Screening detected right breast calcifications spanning 4.6 cm right breast lower outer quadrant, biopsy revealed low-grade DCIS within a complex sclerosing lesion ER/PR positive, Tis NX stage 0  Treatment plan: 1.  Because the patient is moving to [place] she would like to get the mastectomy performed in [place] and reconstruction as well. 2.  Because she is moving end of November, I recommended starting her on tamoxifen 20 mg daily.  We discussed the risks and benefits of tamoxifen. These include but not limited to insomnia, hot flashes, mood changes, vaginal dryness, and weight gain. Although rare, serious side effects including endometrial cancer, risk of blood clots were also discussed. We strongly believe that the benefits far outweigh the risks. Patient understands these risks and consented to starting treatment.  Patient was informed as to where she is going to in [place] so that we can forward her records to them.

## 2019-10-04 ENCOUNTER — Telehealth: Payer: Self-pay | Admitting: Hematology and Oncology

## 2019-10-04 NOTE — Telephone Encounter (Signed)
No 10/29 LOS  

## 2019-10-07 ENCOUNTER — Other Ambulatory Visit: Payer: Self-pay

## 2019-10-07 MED ORDER — TAMOXIFEN CITRATE 20 MG PO TABS: 20.0000 mg | ORAL_TABLET | Freq: Every day | ORAL | 3 refills | Status: AC

## 2019-11-12 ENCOUNTER — Encounter: Payer: Self-pay | Admitting: *Deleted

## 2019-12-05 ENCOUNTER — Encounter: Payer: Self-pay | Admitting: *Deleted

## 2020-05-01 ENCOUNTER — Other Ambulatory Visit (INDEPENDENT_AMBULATORY_CARE_PROVIDER_SITE_OTHER): Payer: BC Managed Care – PPO

## 2020-05-01 ENCOUNTER — Other Ambulatory Visit: Payer: Self-pay

## 2020-05-01 ENCOUNTER — Other Ambulatory Visit: Payer: Self-pay | Admitting: Internal Medicine

## 2020-05-01 DIAGNOSIS — E0500 Thyrotoxicosis with diffuse goiter without thyrotoxic crisis or storm: Secondary | ICD-10-CM | POA: Diagnosis not present

## 2020-05-01 LAB — TSH: TSH: 0.03 u[IU]/mL — ABNORMAL LOW (ref 0.35–4.50)

## 2020-05-01 LAB — T4, FREE: Free T4: 1.15 ng/dL (ref 0.60–1.60)

## 2020-05-01 LAB — T3, FREE: T3, Free: 3.8 pg/mL (ref 2.3–4.2)

## 2020-05-01 MED ORDER — METHIMAZOLE 5 MG PO TABS: ORAL_TABLET | ORAL | 1 refills | Status: DC

## 2020-08-05 ENCOUNTER — Other Ambulatory Visit: Payer: Self-pay | Admitting: Internal Medicine

## 2020-11-18 ENCOUNTER — Other Ambulatory Visit: Payer: Self-pay | Admitting: Internal Medicine

## 2020-12-14 ENCOUNTER — Other Ambulatory Visit: Payer: Self-pay | Admitting: Internal Medicine

## 2020-12-29 ENCOUNTER — Other Ambulatory Visit: Payer: Self-pay | Admitting: Internal Medicine

## 2020-12-29 NOTE — Telephone Encounter (Signed)
Needs F/u visit for further refills!

## 2021-01-12 ENCOUNTER — Other Ambulatory Visit: Payer: Self-pay | Admitting: Internal Medicine

## 2021-02-05 ENCOUNTER — Other Ambulatory Visit: Payer: Self-pay | Admitting: Internal Medicine

## 2021-03-03 ENCOUNTER — Other Ambulatory Visit: Payer: Self-pay | Admitting: Internal Medicine

## 2021-03-26 ENCOUNTER — Other Ambulatory Visit: Payer: Self-pay | Admitting: Internal Medicine

## 2021-07-20 IMAGING — MG MM BREAST BX W/ LOC DEV EA AD LESION IMAG BX SPEC STEREO GUIDE*R
7 of 9 series · 7 of 17 positions shown · non-contrast
Comparison: Previous exams.
COMPARISON: Previous exams.

Addendum:
CLINICAL DATA: Patient with indeterminate right breast
calcifications.

EXAM:
RIGHT BREAST STEREOTACTIC CORE NEEDLE BIOPSY

[R (1 of 6)]
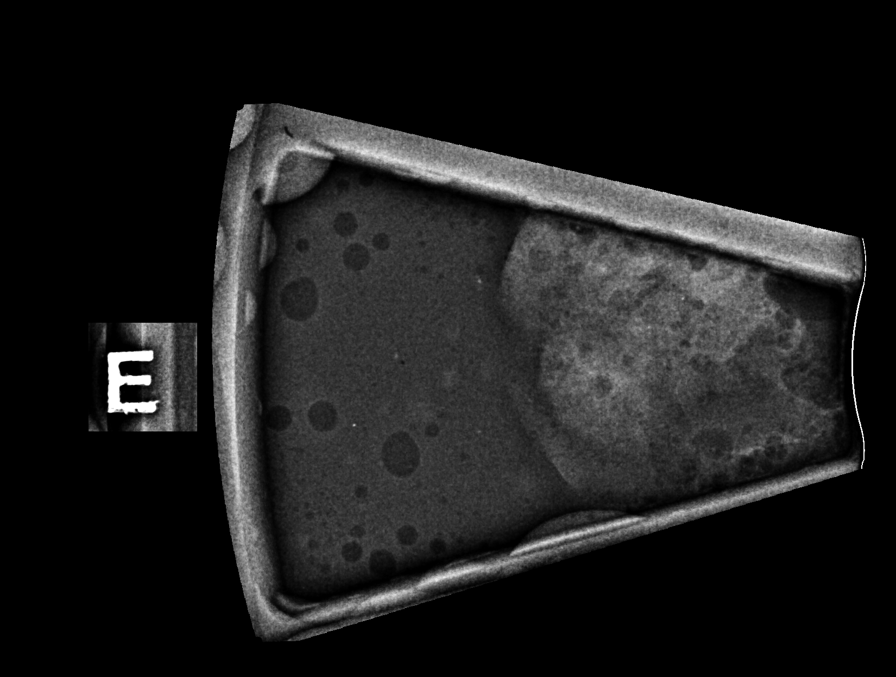

[R (2 of 6)]
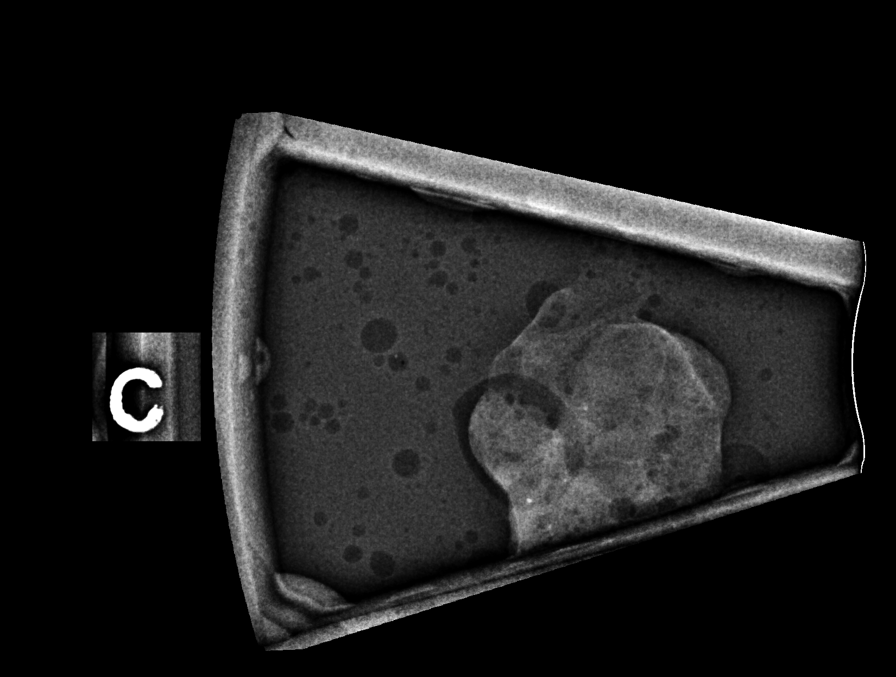

[R (3 of 6)]
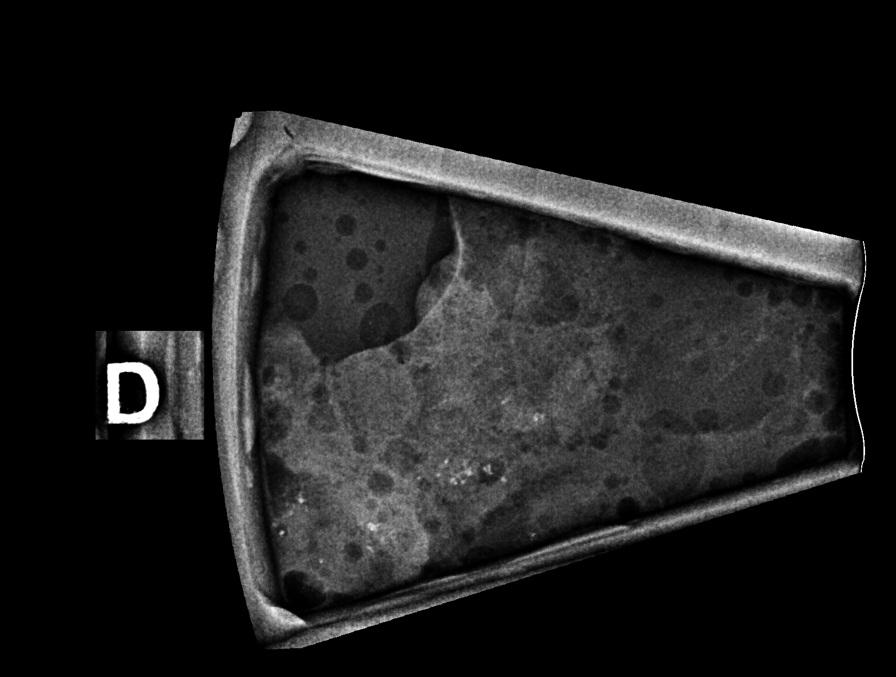

[R (4 of 6)]
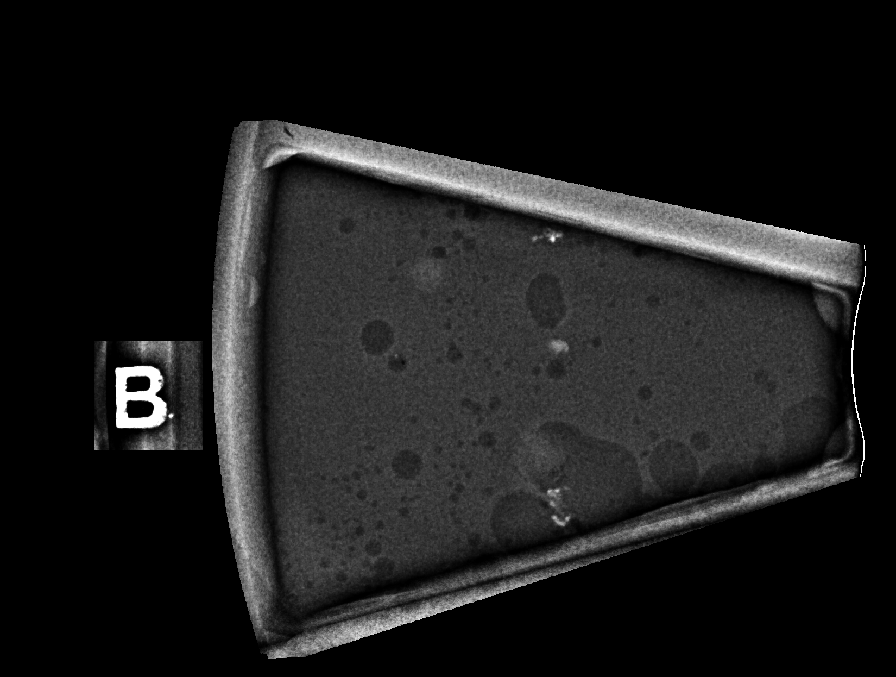

[R (5 of 6)]
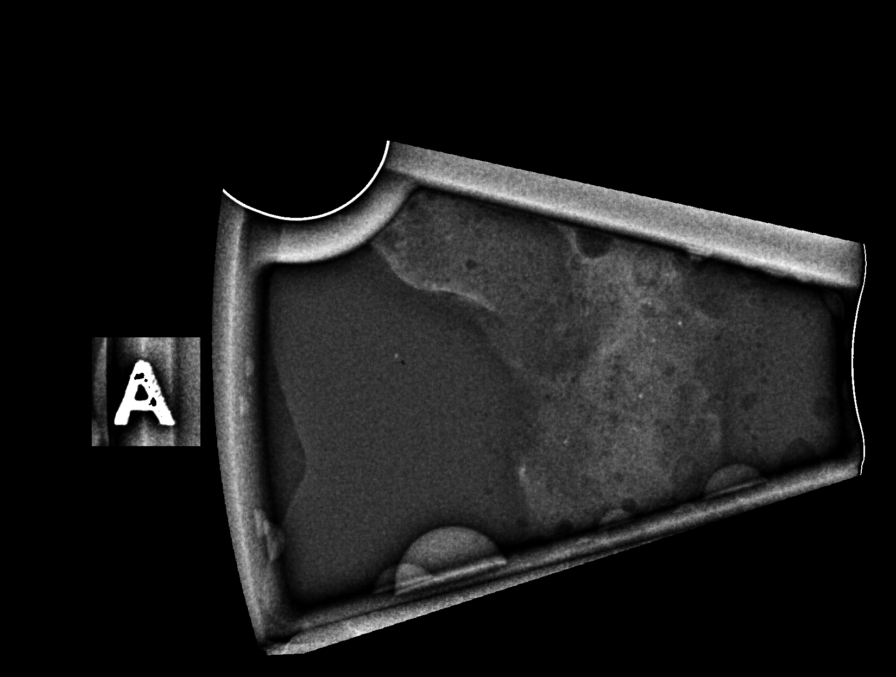

[R (6 of 6)]
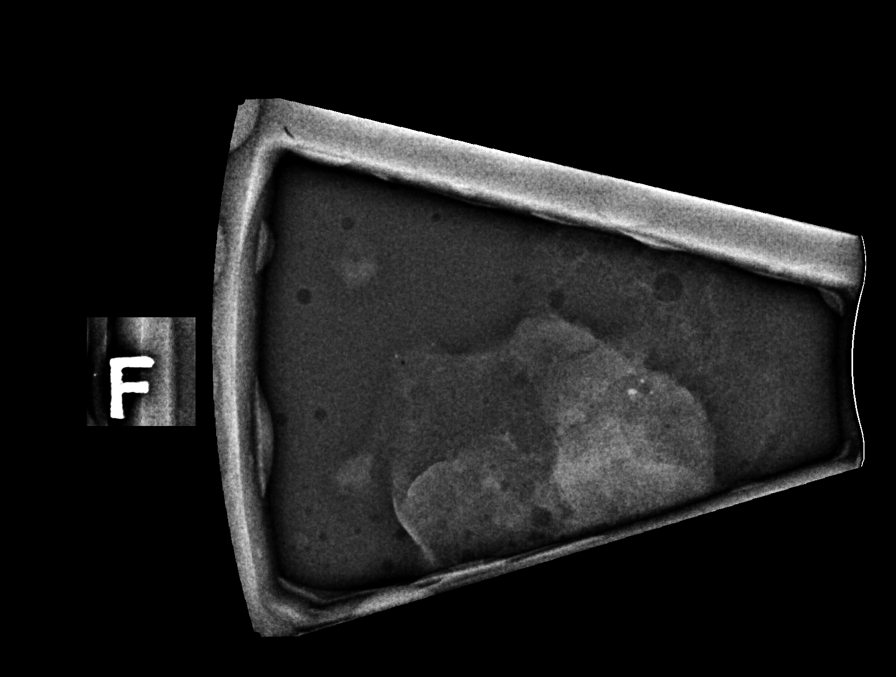

[R LM]
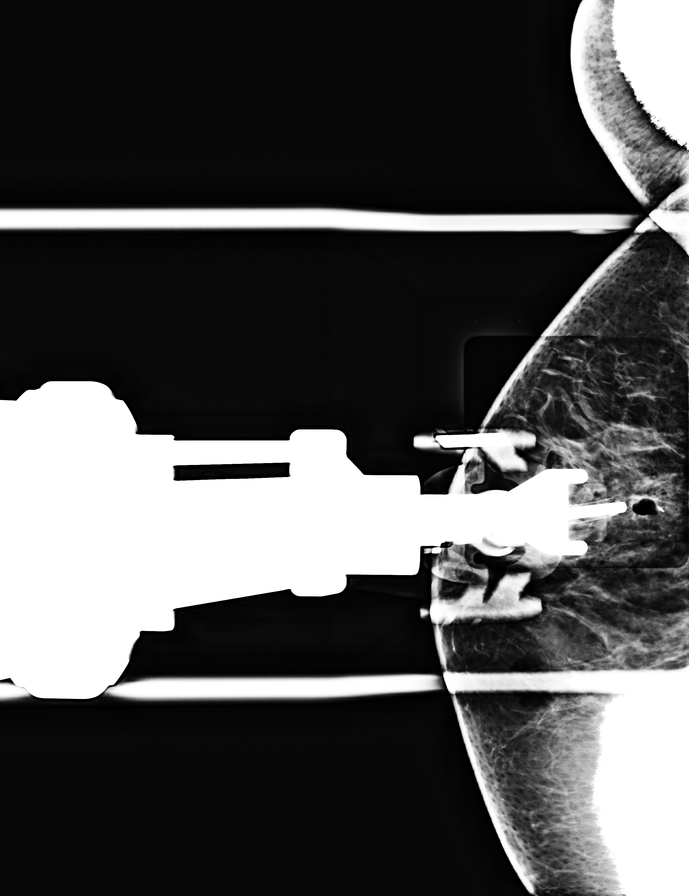

[7 of 17 positions shown; findings below may reference images not displayed]



Site 1: Outer right breast posterior depth: Coil shaped clip.

Using sterile technique and 1% Lidocaine as local anesthetic, under
stereotactic guidance, a 9 gauge vacuum assisted device was used to
perform core needle biopsy of outer right breast posterior depth
using a lateral approach. Specimen radiograph was performed showing
calcifications. Specimens with calcifications are identified for
pathology.

Lesion quadrant: Upper outer quadrant

At the conclusion of the procedure, coil shaped tissue marker clip
was deployed into the biopsy cavity. Follow-up 2-view mammogram was
performed and dictated separately.

Site 2: Outer right breast anterior depth: X shaped clip.

Using sterile technique and 1% Lidocaine as local anesthetic, under
stereotactic guidance, a 9 gauge vacuum assisted device was used to
perform core needle biopsy of calcifications within the outer right
breast anterior depth using a lateral approach. Specimen radiograph
was performed showing calcifications. Specimens with calcifications
are identified for pathology.

Lesion quadrant: Upper outer quadrant

At the conclusion of the procedure, X shaped tissue marker clip was
deployed into the biopsy cavity. Follow-up 2-view mammogram was
performed and dictated separately.
IMPRESSION: Stereotactic-guided biopsy of right breast calcifications, anterior
and posterior extent. No apparent complications.

ADDENDUM:
PATHOLOGY revealed: Diagnosis:1. Breast, RIGHT, needle core biopsy,
lateral posterior (coil)- COMPLEX SCLEROSING LESION WITH DUCTAL
CARCINOMA IN SITU AND CALCIFICATIONS, AND INTRADUCTAL PAPILLOMA.-
SEE COMMENT.2. Breast, right, needle core biopsy, lateral anterior
(X)- COMPLEX SCLEROSING LESION WITH DUCTAL CARCINOMA IN SITU AND
CALCIFICATIONS, AND INTRADUCTAL PAPILLOMA.- SEE COMMENT: 1. and 2.
The carcinoma in situ appears low grade. Prognostic markers will be
ordered. Parts #1 and #2 are morphologically similar.

Pathology results are CONCORDANT with imaging findings, per Dr. Zeinab
Sattar.

Pathology results were discussed with patient via telephone. The
patient reported doing well after the biopsy with tenderness at the
site. Post biopsy care instructions were reviewed and questions were
answered. The patient was encouraged to call [REDACTED] of

Recommendation: Surgical consultation arranged for patient to see
Dr. Noora Oda at [REDACTED] on 09/24/2019.

Addendum by Klpigbb Moolman RN on 09/05/2019.



Site 1: Outer right breast posterior depth: Coil shaped clip.

Using sterile technique and 1% Lidocaine as local anesthetic, under
stereotactic guidance, a 9 gauge vacuum assisted device was used to
perform core needle biopsy of outer right breast posterior depth
using a lateral approach. Specimen radiograph was performed showing
calcifications. Specimens with calcifications are identified for
pathology.

Lesion quadrant: Upper outer quadrant

At the conclusion of the procedure, coil shaped tissue marker clip
was deployed into the biopsy cavity. Follow-up 2-view mammogram was
performed and dictated separately.

Site 2: Outer right breast anterior depth: X shaped clip.

Using sterile technique and 1% Lidocaine as local anesthetic, under
stereotactic guidance, a 9 gauge vacuum assisted device was used to
perform core needle biopsy of calcifications within the outer right
breast anterior depth using a lateral approach. Specimen radiograph
was performed showing calcifications. Specimens with calcifications
are identified for pathology.

Lesion quadrant: Upper outer quadrant

At the conclusion of the procedure, X shaped tissue marker clip was
deployed into the biopsy cavity. Follow-up 2-view mammogram was
performed and dictated separately.
IMPRESSION: Stereotactic-guided biopsy of right breast calcifications, anterior
and posterior extent. No apparent complications.
